# Patient Record
Sex: Male | Born: 1935
Health system: Southern US, Community
[De-identification: ages and names within clinical notes are randomized; demographics above are authoritative.]

## PROBLEM LIST (undated history)

## (undated) DIAGNOSIS — I2510 Atherosclerotic heart disease of native coronary artery without angina pectoris: Secondary | ICD-10-CM

## (undated) DIAGNOSIS — I10 Essential (primary) hypertension: Secondary | ICD-10-CM

## (undated) DIAGNOSIS — R011 Cardiac murmur, unspecified: Secondary | ICD-10-CM

## (undated) DIAGNOSIS — K219 Gastro-esophageal reflux disease without esophagitis: Secondary | ICD-10-CM

## (undated) DIAGNOSIS — N189 Chronic kidney disease, unspecified: Secondary | ICD-10-CM

## (undated) DIAGNOSIS — E785 Hyperlipidemia, unspecified: Secondary | ICD-10-CM

## (undated) DIAGNOSIS — I351 Nonrheumatic aortic (valve) insufficiency: Secondary | ICD-10-CM

## (undated) DIAGNOSIS — I219 Acute myocardial infarction, unspecified: Secondary | ICD-10-CM

## (undated) HISTORY — DX: Hyperlipidemia, unspecified: E78.5

## (undated) HISTORY — DX: Nonrheumatic aortic (valve) insufficiency: I35.1

## (undated) HISTORY — DX: Atherosclerotic heart disease of native coronary artery without angina pectoris: I25.10

## (undated) HISTORY — DX: Essential (primary) hypertension: I10

## (undated) HISTORY — DX: Gastro-esophageal reflux disease without esophagitis: K21.9

## (undated) HISTORY — DX: Chronic kidney disease, unspecified: N18.9

## (undated) HISTORY — DX: Cardiac murmur, unspecified: R01.1

## (undated) HISTORY — DX: Acute myocardial infarction, unspecified: I21.9

---

## 1999-03-21 HISTORY — PX: CORONARY ARTERY BYPASS GRAFT: SHX141

## 2013-08-21 DIAGNOSIS — Z8546 Personal history of malignant neoplasm of prostate: Secondary | ICD-10-CM | POA: Diagnosis not present

## 2013-08-21 DIAGNOSIS — M810 Age-related osteoporosis without current pathological fracture: Secondary | ICD-10-CM | POA: Diagnosis present

## 2013-08-21 DIAGNOSIS — Z85828 Personal history of other malignant neoplasm of skin: Secondary | ICD-10-CM | POA: Diagnosis not present

## 2013-08-21 DIAGNOSIS — R509 Fever, unspecified: Secondary | ICD-10-CM | POA: Diagnosis not present

## 2013-08-21 DIAGNOSIS — N183 Chronic kidney disease, stage 3 unspecified: Secondary | ICD-10-CM | POA: Diagnosis not present

## 2013-08-21 DIAGNOSIS — L03119 Cellulitis of unspecified part of limb: Secondary | ICD-10-CM | POA: Diagnosis not present

## 2013-08-21 DIAGNOSIS — L02419 Cutaneous abscess of limb, unspecified: Secondary | ICD-10-CM | POA: Diagnosis present

## 2013-08-21 DIAGNOSIS — J984 Other disorders of lung: Secondary | ICD-10-CM | POA: Diagnosis not present

## 2013-08-21 DIAGNOSIS — M7989 Other specified soft tissue disorders: Secondary | ICD-10-CM | POA: Diagnosis not present

## 2013-08-21 DIAGNOSIS — E785 Hyperlipidemia, unspecified: Secondary | ICD-10-CM | POA: Diagnosis not present

## 2013-08-21 DIAGNOSIS — I359 Nonrheumatic aortic valve disorder, unspecified: Secondary | ICD-10-CM | POA: Diagnosis not present

## 2013-08-21 DIAGNOSIS — L0291 Cutaneous abscess, unspecified: Secondary | ICD-10-CM | POA: Diagnosis not present

## 2013-08-21 DIAGNOSIS — I872 Venous insufficiency (chronic) (peripheral): Secondary | ICD-10-CM | POA: Diagnosis present

## 2013-08-21 DIAGNOSIS — I951 Orthostatic hypotension: Secondary | ICD-10-CM | POA: Diagnosis not present

## 2013-08-21 DIAGNOSIS — I129 Hypertensive chronic kidney disease with stage 1 through stage 4 chronic kidney disease, or unspecified chronic kidney disease: Secondary | ICD-10-CM | POA: Diagnosis present

## 2013-08-21 DIAGNOSIS — I2510 Atherosclerotic heart disease of native coronary artery without angina pectoris: Secondary | ICD-10-CM | POA: Diagnosis not present

## 2013-08-26 DIAGNOSIS — L0390 Cellulitis, unspecified: Secondary | ICD-10-CM | POA: Diagnosis not present

## 2013-08-26 DIAGNOSIS — L0291 Cutaneous abscess, unspecified: Secondary | ICD-10-CM | POA: Diagnosis not present

## 2013-09-01 DIAGNOSIS — L02419 Cutaneous abscess of limb, unspecified: Secondary | ICD-10-CM | POA: Diagnosis not present

## 2013-09-01 DIAGNOSIS — R918 Other nonspecific abnormal finding of lung field: Secondary | ICD-10-CM | POA: Diagnosis not present

## 2013-09-01 DIAGNOSIS — N183 Chronic kidney disease, stage 3 unspecified: Secondary | ICD-10-CM | POA: Diagnosis not present

## 2013-09-30 DIAGNOSIS — C61 Malignant neoplasm of prostate: Secondary | ICD-10-CM | POA: Diagnosis not present

## 2013-09-30 DIAGNOSIS — N183 Chronic kidney disease, stage 3 unspecified: Secondary | ICD-10-CM | POA: Diagnosis not present

## 2013-09-30 DIAGNOSIS — Z0000 Encounter for general adult medical examination without abnormal findings: Secondary | ICD-10-CM | POA: Diagnosis not present

## 2013-09-30 DIAGNOSIS — I10 Essential (primary) hypertension: Secondary | ICD-10-CM | POA: Diagnosis not present

## 2013-09-30 DIAGNOSIS — Z125 Encounter for screening for malignant neoplasm of prostate: Secondary | ICD-10-CM | POA: Diagnosis not present

## 2013-09-30 DIAGNOSIS — E782 Mixed hyperlipidemia: Secondary | ICD-10-CM | POA: Diagnosis not present

## 2013-09-30 DIAGNOSIS — I2510 Atherosclerotic heart disease of native coronary artery without angina pectoris: Secondary | ICD-10-CM | POA: Diagnosis not present

## 2013-11-28 DIAGNOSIS — L738 Other specified follicular disorders: Secondary | ICD-10-CM | POA: Diagnosis not present

## 2013-11-28 DIAGNOSIS — L280 Lichen simplex chronicus: Secondary | ICD-10-CM | POA: Diagnosis not present

## 2013-12-03 DIAGNOSIS — Z23 Encounter for immunization: Secondary | ICD-10-CM | POA: Diagnosis not present

## 2014-04-02 DIAGNOSIS — L281 Prurigo nodularis: Secondary | ICD-10-CM | POA: Diagnosis not present

## 2014-04-02 DIAGNOSIS — Z85828 Personal history of other malignant neoplasm of skin: Secondary | ICD-10-CM | POA: Diagnosis not present

## 2014-04-02 DIAGNOSIS — D485 Neoplasm of uncertain behavior of skin: Secondary | ICD-10-CM | POA: Diagnosis not present

## 2014-04-07 DIAGNOSIS — E782 Mixed hyperlipidemia: Secondary | ICD-10-CM | POA: Diagnosis not present

## 2014-04-07 DIAGNOSIS — I10 Essential (primary) hypertension: Secondary | ICD-10-CM | POA: Diagnosis not present

## 2014-04-07 DIAGNOSIS — Z23 Encounter for immunization: Secondary | ICD-10-CM | POA: Diagnosis not present

## 2014-04-07 DIAGNOSIS — K219 Gastro-esophageal reflux disease without esophagitis: Secondary | ICD-10-CM | POA: Diagnosis not present

## 2014-05-21 DIAGNOSIS — X32XXXA Exposure to sunlight, initial encounter: Secondary | ICD-10-CM | POA: Diagnosis not present

## 2014-05-21 DIAGNOSIS — L280 Lichen simplex chronicus: Secondary | ICD-10-CM | POA: Diagnosis not present

## 2014-05-21 DIAGNOSIS — L570 Actinic keratosis: Secondary | ICD-10-CM | POA: Diagnosis not present

## 2014-06-01 DIAGNOSIS — K219 Gastro-esophageal reflux disease without esophagitis: Secondary | ICD-10-CM | POA: Diagnosis not present

## 2014-06-01 DIAGNOSIS — E782 Mixed hyperlipidemia: Secondary | ICD-10-CM | POA: Diagnosis not present

## 2014-06-01 DIAGNOSIS — I2510 Atherosclerotic heart disease of native coronary artery without angina pectoris: Secondary | ICD-10-CM | POA: Diagnosis not present

## 2014-06-01 DIAGNOSIS — I10 Essential (primary) hypertension: Secondary | ICD-10-CM | POA: Diagnosis not present

## 2014-08-27 DIAGNOSIS — I350 Nonrheumatic aortic (valve) stenosis: Secondary | ICD-10-CM | POA: Diagnosis not present

## 2014-08-27 DIAGNOSIS — R351 Nocturia: Secondary | ICD-10-CM | POA: Diagnosis not present

## 2014-08-27 DIAGNOSIS — R600 Localized edema: Secondary | ICD-10-CM | POA: Diagnosis not present

## 2014-08-27 DIAGNOSIS — R42 Dizziness and giddiness: Secondary | ICD-10-CM | POA: Diagnosis not present

## 2014-10-02 DIAGNOSIS — L821 Other seborrheic keratosis: Secondary | ICD-10-CM | POA: Diagnosis not present

## 2014-10-02 DIAGNOSIS — L578 Other skin changes due to chronic exposure to nonionizing radiation: Secondary | ICD-10-CM | POA: Diagnosis not present

## 2014-10-02 DIAGNOSIS — D1801 Hemangioma of skin and subcutaneous tissue: Secondary | ICD-10-CM | POA: Diagnosis not present

## 2014-10-02 DIAGNOSIS — L570 Actinic keratosis: Secondary | ICD-10-CM | POA: Diagnosis not present

## 2014-10-02 DIAGNOSIS — L309 Dermatitis, unspecified: Secondary | ICD-10-CM | POA: Diagnosis not present

## 2014-10-05 DIAGNOSIS — E785 Hyperlipidemia, unspecified: Secondary | ICD-10-CM | POA: Diagnosis not present

## 2014-10-05 DIAGNOSIS — I2510 Atherosclerotic heart disease of native coronary artery without angina pectoris: Secondary | ICD-10-CM | POA: Diagnosis not present

## 2014-10-05 DIAGNOSIS — I10 Essential (primary) hypertension: Secondary | ICD-10-CM | POA: Diagnosis not present

## 2014-10-06 DIAGNOSIS — R350 Frequency of micturition: Secondary | ICD-10-CM | POA: Diagnosis not present

## 2014-10-19 DIAGNOSIS — I2510 Atherosclerotic heart disease of native coronary artery without angina pectoris: Secondary | ICD-10-CM | POA: Diagnosis not present

## 2014-10-19 DIAGNOSIS — I252 Old myocardial infarction: Secondary | ICD-10-CM | POA: Diagnosis not present

## 2014-10-19 DIAGNOSIS — Z951 Presence of aortocoronary bypass graft: Secondary | ICD-10-CM | POA: Diagnosis not present

## 2014-10-22 DIAGNOSIS — R351 Nocturia: Secondary | ICD-10-CM | POA: Diagnosis not present

## 2014-11-30 DIAGNOSIS — R351 Nocturia: Secondary | ICD-10-CM | POA: Diagnosis not present

## 2014-12-01 DIAGNOSIS — H25041 Posterior subcapsular polar age-related cataract, right eye: Secondary | ICD-10-CM | POA: Diagnosis not present

## 2014-12-01 DIAGNOSIS — H52203 Unspecified astigmatism, bilateral: Secondary | ICD-10-CM | POA: Diagnosis not present

## 2014-12-01 DIAGNOSIS — H5213 Myopia, bilateral: Secondary | ICD-10-CM | POA: Diagnosis not present

## 2014-12-01 DIAGNOSIS — H524 Presbyopia: Secondary | ICD-10-CM | POA: Diagnosis not present

## 2014-12-01 DIAGNOSIS — H2513 Age-related nuclear cataract, bilateral: Secondary | ICD-10-CM | POA: Diagnosis not present

## 2014-12-03 DIAGNOSIS — R351 Nocturia: Secondary | ICD-10-CM | POA: Diagnosis not present

## 2014-12-08 DIAGNOSIS — E785 Hyperlipidemia, unspecified: Secondary | ICD-10-CM | POA: Diagnosis not present

## 2014-12-08 DIAGNOSIS — I10 Essential (primary) hypertension: Secondary | ICD-10-CM | POA: Diagnosis not present

## 2014-12-14 DIAGNOSIS — E785 Hyperlipidemia, unspecified: Secondary | ICD-10-CM | POA: Diagnosis not present

## 2014-12-14 DIAGNOSIS — I10 Essential (primary) hypertension: Secondary | ICD-10-CM | POA: Diagnosis not present

## 2014-12-14 DIAGNOSIS — T464X5A Adverse effect of angiotensin-converting-enzyme inhibitors, initial encounter: Secondary | ICD-10-CM | POA: Diagnosis not present

## 2014-12-14 DIAGNOSIS — I2510 Atherosclerotic heart disease of native coronary artery without angina pectoris: Secondary | ICD-10-CM | POA: Diagnosis not present

## 2014-12-25 DIAGNOSIS — Z23 Encounter for immunization: Secondary | ICD-10-CM | POA: Diagnosis not present

## 2015-04-09 DIAGNOSIS — Z85828 Personal history of other malignant neoplasm of skin: Secondary | ICD-10-CM | POA: Diagnosis not present

## 2015-04-09 DIAGNOSIS — C44222 Squamous cell carcinoma of skin of right ear and external auricular canal: Secondary | ICD-10-CM | POA: Diagnosis not present

## 2015-04-09 DIAGNOSIS — L309 Dermatitis, unspecified: Secondary | ICD-10-CM | POA: Diagnosis not present

## 2015-04-15 DIAGNOSIS — Z125 Encounter for screening for malignant neoplasm of prostate: Secondary | ICD-10-CM | POA: Diagnosis not present

## 2015-04-15 DIAGNOSIS — I10 Essential (primary) hypertension: Secondary | ICD-10-CM | POA: Diagnosis not present

## 2015-04-15 DIAGNOSIS — R42 Dizziness and giddiness: Secondary | ICD-10-CM | POA: Diagnosis not present

## 2015-04-15 DIAGNOSIS — R601 Generalized edema: Secondary | ICD-10-CM | POA: Diagnosis not present

## 2015-04-22 DIAGNOSIS — C61 Malignant neoplasm of prostate: Secondary | ICD-10-CM | POA: Diagnosis not present

## 2015-04-22 DIAGNOSIS — N183 Chronic kidney disease, stage 3 (moderate): Secondary | ICD-10-CM | POA: Diagnosis not present

## 2015-04-22 DIAGNOSIS — I10 Essential (primary) hypertension: Secondary | ICD-10-CM | POA: Diagnosis not present

## 2015-04-22 DIAGNOSIS — I2510 Atherosclerotic heart disease of native coronary artery without angina pectoris: Secondary | ICD-10-CM | POA: Diagnosis not present

## 2015-04-22 DIAGNOSIS — Z1389 Encounter for screening for other disorder: Secondary | ICD-10-CM | POA: Diagnosis not present

## 2015-04-22 DIAGNOSIS — R42 Dizziness and giddiness: Secondary | ICD-10-CM | POA: Diagnosis not present

## 2015-04-22 DIAGNOSIS — E784 Other hyperlipidemia: Secondary | ICD-10-CM | POA: Diagnosis not present

## 2015-04-22 DIAGNOSIS — R351 Nocturia: Secondary | ICD-10-CM | POA: Diagnosis not present

## 2015-04-22 DIAGNOSIS — Z6825 Body mass index (BMI) 25.0-25.9, adult: Secondary | ICD-10-CM | POA: Diagnosis not present

## 2015-04-22 DIAGNOSIS — Z0000 Encounter for general adult medical examination without abnormal findings: Secondary | ICD-10-CM | POA: Diagnosis not present

## 2015-04-28 DIAGNOSIS — E785 Hyperlipidemia, unspecified: Secondary | ICD-10-CM | POA: Diagnosis not present

## 2015-04-28 DIAGNOSIS — I10 Essential (primary) hypertension: Secondary | ICD-10-CM | POA: Diagnosis not present

## 2015-04-28 DIAGNOSIS — I2510 Atherosclerotic heart disease of native coronary artery without angina pectoris: Secondary | ICD-10-CM | POA: Diagnosis not present

## 2015-05-27 DIAGNOSIS — R350 Frequency of micturition: Secondary | ICD-10-CM | POA: Diagnosis not present

## 2015-05-27 DIAGNOSIS — Z0000 Encounter for general adult medical examination without abnormal findings: Secondary | ICD-10-CM | POA: Diagnosis not present

## 2015-05-27 DIAGNOSIS — R351 Nocturia: Secondary | ICD-10-CM | POA: Diagnosis not present

## 2015-07-01 DIAGNOSIS — E785 Hyperlipidemia, unspecified: Secondary | ICD-10-CM | POA: Diagnosis not present

## 2015-07-01 DIAGNOSIS — I2510 Atherosclerotic heart disease of native coronary artery without angina pectoris: Secondary | ICD-10-CM | POA: Diagnosis not present

## 2015-09-01 DIAGNOSIS — I10 Essential (primary) hypertension: Secondary | ICD-10-CM | POA: Diagnosis not present

## 2015-09-01 DIAGNOSIS — E785 Hyperlipidemia, unspecified: Secondary | ICD-10-CM | POA: Diagnosis not present

## 2015-09-01 DIAGNOSIS — I2510 Atherosclerotic heart disease of native coronary artery without angina pectoris: Secondary | ICD-10-CM | POA: Diagnosis not present

## 2015-11-29 DIAGNOSIS — Z23 Encounter for immunization: Secondary | ICD-10-CM | POA: Diagnosis not present

## 2015-12-22 DIAGNOSIS — S62304A Unspecified fracture of fourth metacarpal bone, right hand, initial encounter for closed fracture: Secondary | ICD-10-CM | POA: Diagnosis not present

## 2015-12-29 DIAGNOSIS — I2510 Atherosclerotic heart disease of native coronary artery without angina pectoris: Secondary | ICD-10-CM | POA: Diagnosis not present

## 2015-12-29 DIAGNOSIS — E785 Hyperlipidemia, unspecified: Secondary | ICD-10-CM | POA: Diagnosis not present

## 2016-01-04 DIAGNOSIS — I2510 Atherosclerotic heart disease of native coronary artery without angina pectoris: Secondary | ICD-10-CM | POA: Diagnosis not present

## 2016-01-04 DIAGNOSIS — E785 Hyperlipidemia, unspecified: Secondary | ICD-10-CM | POA: Diagnosis not present

## 2016-01-04 DIAGNOSIS — I10 Essential (primary) hypertension: Secondary | ICD-10-CM | POA: Diagnosis not present

## 2016-01-12 DIAGNOSIS — S62304D Unspecified fracture of fourth metacarpal bone, right hand, subsequent encounter for fracture with routine healing: Secondary | ICD-10-CM | POA: Diagnosis not present

## 2016-03-22 DIAGNOSIS — H25041 Posterior subcapsular polar age-related cataract, right eye: Secondary | ICD-10-CM | POA: Diagnosis not present

## 2016-03-22 DIAGNOSIS — H2513 Age-related nuclear cataract, bilateral: Secondary | ICD-10-CM | POA: Diagnosis not present

## 2016-04-11 DIAGNOSIS — L570 Actinic keratosis: Secondary | ICD-10-CM | POA: Diagnosis not present

## 2016-04-11 DIAGNOSIS — L309 Dermatitis, unspecified: Secondary | ICD-10-CM | POA: Diagnosis not present

## 2016-04-11 DIAGNOSIS — D1801 Hemangioma of skin and subcutaneous tissue: Secondary | ICD-10-CM | POA: Diagnosis not present

## 2016-04-11 DIAGNOSIS — Z85828 Personal history of other malignant neoplasm of skin: Secondary | ICD-10-CM | POA: Diagnosis not present

## 2016-04-11 DIAGNOSIS — C44622 Squamous cell carcinoma of skin of right upper limb, including shoulder: Secondary | ICD-10-CM | POA: Diagnosis not present

## 2016-04-21 DIAGNOSIS — R8299 Other abnormal findings in urine: Secondary | ICD-10-CM | POA: Diagnosis not present

## 2016-04-21 DIAGNOSIS — N183 Chronic kidney disease, stage 3 (moderate): Secondary | ICD-10-CM | POA: Diagnosis not present

## 2016-04-21 DIAGNOSIS — E784 Other hyperlipidemia: Secondary | ICD-10-CM | POA: Diagnosis not present

## 2016-04-21 DIAGNOSIS — Z125 Encounter for screening for malignant neoplasm of prostate: Secondary | ICD-10-CM | POA: Diagnosis not present

## 2016-04-28 DIAGNOSIS — N183 Chronic kidney disease, stage 3 (moderate): Secondary | ICD-10-CM | POA: Diagnosis not present

## 2016-04-28 DIAGNOSIS — I2510 Atherosclerotic heart disease of native coronary artery without angina pectoris: Secondary | ICD-10-CM | POA: Diagnosis not present

## 2016-04-28 DIAGNOSIS — Z1389 Encounter for screening for other disorder: Secondary | ICD-10-CM | POA: Diagnosis not present

## 2016-04-28 DIAGNOSIS — C61 Malignant neoplasm of prostate: Secondary | ICD-10-CM | POA: Diagnosis not present

## 2016-04-28 DIAGNOSIS — R351 Nocturia: Secondary | ICD-10-CM | POA: Diagnosis not present

## 2016-04-28 DIAGNOSIS — Z6825 Body mass index (BMI) 25.0-25.9, adult: Secondary | ICD-10-CM | POA: Diagnosis not present

## 2016-04-28 DIAGNOSIS — Z0000 Encounter for general adult medical examination without abnormal findings: Secondary | ICD-10-CM | POA: Diagnosis not present

## 2016-04-28 DIAGNOSIS — I10 Essential (primary) hypertension: Secondary | ICD-10-CM | POA: Diagnosis not present

## 2016-05-16 DIAGNOSIS — H2511 Age-related nuclear cataract, right eye: Secondary | ICD-10-CM | POA: Diagnosis not present

## 2016-05-16 DIAGNOSIS — H2513 Age-related nuclear cataract, bilateral: Secondary | ICD-10-CM | POA: Diagnosis not present

## 2016-05-16 DIAGNOSIS — H02839 Dermatochalasis of unspecified eye, unspecified eyelid: Secondary | ICD-10-CM | POA: Diagnosis not present

## 2016-05-16 DIAGNOSIS — H25013 Cortical age-related cataract, bilateral: Secondary | ICD-10-CM | POA: Diagnosis not present

## 2016-05-16 DIAGNOSIS — H18413 Arcus senilis, bilateral: Secondary | ICD-10-CM | POA: Diagnosis not present

## 2016-06-06 DIAGNOSIS — I10 Essential (primary) hypertension: Secondary | ICD-10-CM | POA: Diagnosis not present

## 2016-06-06 DIAGNOSIS — I2510 Atherosclerotic heart disease of native coronary artery without angina pectoris: Secondary | ICD-10-CM | POA: Diagnosis not present

## 2016-06-06 DIAGNOSIS — E785 Hyperlipidemia, unspecified: Secondary | ICD-10-CM | POA: Diagnosis not present

## 2016-06-26 DIAGNOSIS — H2511 Age-related nuclear cataract, right eye: Secondary | ICD-10-CM | POA: Diagnosis not present

## 2016-06-26 DIAGNOSIS — H2513 Age-related nuclear cataract, bilateral: Secondary | ICD-10-CM | POA: Diagnosis not present

## 2016-10-09 DIAGNOSIS — I10 Essential (primary) hypertension: Secondary | ICD-10-CM | POA: Diagnosis not present

## 2016-10-09 DIAGNOSIS — I2510 Atherosclerotic heart disease of native coronary artery without angina pectoris: Secondary | ICD-10-CM | POA: Diagnosis not present

## 2016-10-09 DIAGNOSIS — E785 Hyperlipidemia, unspecified: Secondary | ICD-10-CM | POA: Diagnosis not present

## 2016-10-09 DIAGNOSIS — R011 Cardiac murmur, unspecified: Secondary | ICD-10-CM | POA: Diagnosis not present

## 2016-10-10 DIAGNOSIS — L281 Prurigo nodularis: Secondary | ICD-10-CM | POA: Diagnosis not present

## 2016-10-10 DIAGNOSIS — L309 Dermatitis, unspecified: Secondary | ICD-10-CM | POA: Diagnosis not present

## 2016-10-10 DIAGNOSIS — L859 Epidermal thickening, unspecified: Secondary | ICD-10-CM | POA: Diagnosis not present

## 2016-10-10 DIAGNOSIS — L814 Other melanin hyperpigmentation: Secondary | ICD-10-CM | POA: Diagnosis not present

## 2016-10-10 DIAGNOSIS — L739 Follicular disorder, unspecified: Secondary | ICD-10-CM | POA: Diagnosis not present

## 2016-10-10 DIAGNOSIS — L570 Actinic keratosis: Secondary | ICD-10-CM | POA: Diagnosis not present

## 2016-10-10 DIAGNOSIS — Z85828 Personal history of other malignant neoplasm of skin: Secondary | ICD-10-CM | POA: Diagnosis not present

## 2016-10-10 DIAGNOSIS — D692 Other nonthrombocytopenic purpura: Secondary | ICD-10-CM | POA: Diagnosis not present

## 2016-10-25 DIAGNOSIS — R011 Cardiac murmur, unspecified: Secondary | ICD-10-CM | POA: Diagnosis not present

## 2016-10-25 DIAGNOSIS — Z136 Encounter for screening for cardiovascular disorders: Secondary | ICD-10-CM | POA: Diagnosis not present

## 2016-10-31 DIAGNOSIS — Z23 Encounter for immunization: Secondary | ICD-10-CM | POA: Diagnosis not present

## 2016-11-28 DIAGNOSIS — R351 Nocturia: Secondary | ICD-10-CM | POA: Diagnosis not present

## 2016-11-28 DIAGNOSIS — N289 Disorder of kidney and ureter, unspecified: Secondary | ICD-10-CM | POA: Diagnosis not present

## 2016-12-26 DIAGNOSIS — R351 Nocturia: Secondary | ICD-10-CM | POA: Diagnosis not present

## 2017-04-30 DIAGNOSIS — I2510 Atherosclerotic heart disease of native coronary artery without angina pectoris: Secondary | ICD-10-CM | POA: Diagnosis not present

## 2017-04-30 DIAGNOSIS — I351 Nonrheumatic aortic (valve) insufficiency: Secondary | ICD-10-CM | POA: Diagnosis not present

## 2017-04-30 DIAGNOSIS — I10 Essential (primary) hypertension: Secondary | ICD-10-CM | POA: Diagnosis not present

## 2017-04-30 DIAGNOSIS — E785 Hyperlipidemia, unspecified: Secondary | ICD-10-CM | POA: Diagnosis not present

## 2017-05-01 DIAGNOSIS — E785 Hyperlipidemia, unspecified: Secondary | ICD-10-CM | POA: Diagnosis not present

## 2017-05-01 DIAGNOSIS — I2510 Atherosclerotic heart disease of native coronary artery without angina pectoris: Secondary | ICD-10-CM | POA: Diagnosis not present

## 2017-05-29 DIAGNOSIS — R82998 Other abnormal findings in urine: Secondary | ICD-10-CM | POA: Diagnosis not present

## 2017-05-29 DIAGNOSIS — I10 Essential (primary) hypertension: Secondary | ICD-10-CM | POA: Diagnosis not present

## 2017-05-29 DIAGNOSIS — E7849 Other hyperlipidemia: Secondary | ICD-10-CM | POA: Diagnosis not present

## 2017-05-29 DIAGNOSIS — Z125 Encounter for screening for malignant neoplasm of prostate: Secondary | ICD-10-CM | POA: Diagnosis not present

## 2017-06-05 DIAGNOSIS — R600 Localized edema: Secondary | ICD-10-CM | POA: Diagnosis not present

## 2017-06-05 DIAGNOSIS — C61 Malignant neoplasm of prostate: Secondary | ICD-10-CM | POA: Diagnosis not present

## 2017-06-05 DIAGNOSIS — E7849 Other hyperlipidemia: Secondary | ICD-10-CM | POA: Diagnosis not present

## 2017-06-05 DIAGNOSIS — I10 Essential (primary) hypertension: Secondary | ICD-10-CM | POA: Diagnosis not present

## 2017-06-05 DIAGNOSIS — I351 Nonrheumatic aortic (valve) insufficiency: Secondary | ICD-10-CM | POA: Diagnosis not present

## 2017-06-05 DIAGNOSIS — N183 Chronic kidney disease, stage 3 (moderate): Secondary | ICD-10-CM | POA: Diagnosis not present

## 2017-06-05 DIAGNOSIS — Z6824 Body mass index (BMI) 24.0-24.9, adult: Secondary | ICD-10-CM | POA: Diagnosis not present

## 2017-06-05 DIAGNOSIS — I2510 Atherosclerotic heart disease of native coronary artery without angina pectoris: Secondary | ICD-10-CM | POA: Diagnosis not present

## 2017-06-05 DIAGNOSIS — I350 Nonrheumatic aortic (valve) stenosis: Secondary | ICD-10-CM | POA: Diagnosis not present

## 2017-06-05 DIAGNOSIS — Z0000 Encounter for general adult medical examination without abnormal findings: Secondary | ICD-10-CM | POA: Diagnosis not present

## 2017-06-05 DIAGNOSIS — Z1389 Encounter for screening for other disorder: Secondary | ICD-10-CM | POA: Diagnosis not present

## 2017-06-05 DIAGNOSIS — F0390 Unspecified dementia without behavioral disturbance: Secondary | ICD-10-CM | POA: Diagnosis not present

## 2017-06-06 DIAGNOSIS — Z1212 Encounter for screening for malignant neoplasm of rectum: Secondary | ICD-10-CM | POA: Diagnosis not present

## 2017-08-02 DIAGNOSIS — H2512 Age-related nuclear cataract, left eye: Secondary | ICD-10-CM | POA: Diagnosis not present

## 2017-08-21 DIAGNOSIS — E785 Hyperlipidemia, unspecified: Secondary | ICD-10-CM | POA: Diagnosis not present

## 2017-08-21 DIAGNOSIS — I351 Nonrheumatic aortic (valve) insufficiency: Secondary | ICD-10-CM | POA: Diagnosis not present

## 2017-08-21 DIAGNOSIS — I2510 Atherosclerotic heart disease of native coronary artery without angina pectoris: Secondary | ICD-10-CM | POA: Diagnosis not present

## 2017-08-21 DIAGNOSIS — I10 Essential (primary) hypertension: Secondary | ICD-10-CM | POA: Diagnosis not present

## 2017-09-11 DIAGNOSIS — H18413 Arcus senilis, bilateral: Secondary | ICD-10-CM | POA: Diagnosis not present

## 2017-09-11 DIAGNOSIS — H2512 Age-related nuclear cataract, left eye: Secondary | ICD-10-CM | POA: Diagnosis not present

## 2017-09-11 DIAGNOSIS — Z961 Presence of intraocular lens: Secondary | ICD-10-CM | POA: Diagnosis not present

## 2017-09-11 DIAGNOSIS — H02831 Dermatochalasis of right upper eyelid: Secondary | ICD-10-CM | POA: Diagnosis not present

## 2017-10-04 DIAGNOSIS — H2512 Age-related nuclear cataract, left eye: Secondary | ICD-10-CM | POA: Diagnosis not present

## 2017-10-05 DIAGNOSIS — H2512 Age-related nuclear cataract, left eye: Secondary | ICD-10-CM | POA: Diagnosis not present

## 2017-10-11 DIAGNOSIS — L821 Other seborrheic keratosis: Secondary | ICD-10-CM | POA: Diagnosis not present

## 2017-10-11 DIAGNOSIS — C44629 Squamous cell carcinoma of skin of left upper limb, including shoulder: Secondary | ICD-10-CM | POA: Diagnosis not present

## 2017-10-11 DIAGNOSIS — L309 Dermatitis, unspecified: Secondary | ICD-10-CM | POA: Diagnosis not present

## 2017-10-11 DIAGNOSIS — Z85828 Personal history of other malignant neoplasm of skin: Secondary | ICD-10-CM | POA: Diagnosis not present

## 2017-10-11 DIAGNOSIS — L814 Other melanin hyperpigmentation: Secondary | ICD-10-CM | POA: Diagnosis not present

## 2017-10-11 DIAGNOSIS — L570 Actinic keratosis: Secondary | ICD-10-CM | POA: Diagnosis not present

## 2017-12-01 DIAGNOSIS — Z23 Encounter for immunization: Secondary | ICD-10-CM | POA: Diagnosis not present

## 2017-12-11 DIAGNOSIS — H26491 Other secondary cataract, right eye: Secondary | ICD-10-CM | POA: Diagnosis not present

## 2017-12-17 DIAGNOSIS — I351 Nonrheumatic aortic (valve) insufficiency: Secondary | ICD-10-CM | POA: Diagnosis not present

## 2017-12-17 DIAGNOSIS — I2510 Atherosclerotic heart disease of native coronary artery without angina pectoris: Secondary | ICD-10-CM | POA: Diagnosis not present

## 2017-12-17 DIAGNOSIS — E785 Hyperlipidemia, unspecified: Secondary | ICD-10-CM | POA: Diagnosis not present

## 2017-12-17 DIAGNOSIS — I10 Essential (primary) hypertension: Secondary | ICD-10-CM | POA: Diagnosis not present

## 2017-12-18 DIAGNOSIS — H26491 Other secondary cataract, right eye: Secondary | ICD-10-CM | POA: Diagnosis not present

## 2018-04-03 DIAGNOSIS — I351 Nonrheumatic aortic (valve) insufficiency: Secondary | ICD-10-CM | POA: Diagnosis not present

## 2018-04-03 DIAGNOSIS — I350 Nonrheumatic aortic (valve) stenosis: Secondary | ICD-10-CM | POA: Diagnosis not present

## 2018-04-16 DIAGNOSIS — I351 Nonrheumatic aortic (valve) insufficiency: Secondary | ICD-10-CM | POA: Diagnosis not present

## 2018-04-16 DIAGNOSIS — E785 Hyperlipidemia, unspecified: Secondary | ICD-10-CM | POA: Diagnosis not present

## 2018-04-16 DIAGNOSIS — I2510 Atherosclerotic heart disease of native coronary artery without angina pectoris: Secondary | ICD-10-CM | POA: Diagnosis not present

## 2018-04-16 DIAGNOSIS — I129 Hypertensive chronic kidney disease with stage 1 through stage 4 chronic kidney disease, or unspecified chronic kidney disease: Secondary | ICD-10-CM | POA: Diagnosis not present

## 2018-05-21 ENCOUNTER — Other Ambulatory Visit: Payer: Self-pay

## 2018-05-21 MED ORDER — METOPROLOL TARTRATE 50 MG PO TABS: 50.0000 mg | ORAL_TABLET | Freq: Two times a day (BID) | ORAL | 1 refills | Status: DC

## 2018-05-22 ENCOUNTER — Other Ambulatory Visit: Payer: Self-pay

## 2018-05-22 MED ORDER — METOPROLOL TARTRATE 50 MG PO TABS: 50.0000 mg | ORAL_TABLET | Freq: Two times a day (BID) | ORAL | 1 refills | Status: DC

## 2018-06-10 DIAGNOSIS — E7849 Other hyperlipidemia: Secondary | ICD-10-CM | POA: Diagnosis not present

## 2018-06-10 DIAGNOSIS — Z125 Encounter for screening for malignant neoplasm of prostate: Secondary | ICD-10-CM | POA: Diagnosis not present

## 2018-06-10 DIAGNOSIS — I10 Essential (primary) hypertension: Secondary | ICD-10-CM | POA: Diagnosis not present

## 2018-06-17 DIAGNOSIS — I10 Essential (primary) hypertension: Secondary | ICD-10-CM | POA: Diagnosis not present

## 2018-06-17 DIAGNOSIS — R82998 Other abnormal findings in urine: Secondary | ICD-10-CM | POA: Diagnosis not present

## 2018-06-17 DIAGNOSIS — I351 Nonrheumatic aortic (valve) insufficiency: Secondary | ICD-10-CM | POA: Diagnosis not present

## 2018-06-17 DIAGNOSIS — Z0000 Encounter for general adult medical examination without abnormal findings: Secondary | ICD-10-CM | POA: Diagnosis not present

## 2018-06-17 DIAGNOSIS — E7849 Other hyperlipidemia: Secondary | ICD-10-CM | POA: Diagnosis not present

## 2018-06-17 DIAGNOSIS — C61 Malignant neoplasm of prostate: Secondary | ICD-10-CM | POA: Diagnosis not present

## 2018-06-17 DIAGNOSIS — R600 Localized edema: Secondary | ICD-10-CM | POA: Diagnosis not present

## 2018-06-17 DIAGNOSIS — N183 Chronic kidney disease, stage 3 (moderate): Secondary | ICD-10-CM | POA: Diagnosis not present

## 2018-06-17 DIAGNOSIS — Z1339 Encounter for screening examination for other mental health and behavioral disorders: Secondary | ICD-10-CM | POA: Diagnosis not present

## 2018-06-17 DIAGNOSIS — I350 Nonrheumatic aortic (valve) stenosis: Secondary | ICD-10-CM | POA: Diagnosis not present

## 2018-06-17 DIAGNOSIS — F0390 Unspecified dementia without behavioral disturbance: Secondary | ICD-10-CM | POA: Diagnosis not present

## 2018-06-17 DIAGNOSIS — I2510 Atherosclerotic heart disease of native coronary artery without angina pectoris: Secondary | ICD-10-CM | POA: Diagnosis not present

## 2018-06-17 DIAGNOSIS — Z1331 Encounter for screening for depression: Secondary | ICD-10-CM | POA: Diagnosis not present

## 2018-08-03 DIAGNOSIS — N183 Chronic kidney disease, stage 3 unspecified: Secondary | ICD-10-CM | POA: Insufficient documentation

## 2018-08-05 NOTE — Progress Notes (Deleted)
Subjective:  Primary Physician:  Leanna Battles, MD  Patient ID: Trevor Cooper., male    DOB: 1931-12-31, 85 y.o.   MRN: [id number]  No chief complaint on file.   HPI: Trevor Cooper.  is a 85 y.o. male . He is s/p IWMI in Dec, 2002, and s/p CABG x3- Dec. 2002 in [place]. Pt. recd. LIMA to LAD, SVG to OM, SVG to PDA. Lexiscan Myoview scans were negative for ischemia on 10/19/2014.  Patient has done fairly well since CABG. He denies any complaints of chest pain, tightness or pressure. No complaints of shortness of breath, orthopnea or PND. No palpitation, sudden heart racing or irregular heartbeat at any time. Patient has occasional mild dizziness for a few seconds. No c/o near syncope or syncope. No c/o palpitation. Patient is complaining of feeling low energy.  Patient has history of mild chronic swelling on the left leg for few years, controlled with wearing compression stockings. There is no history of DVT. He did not have veins harvested from left leg for CABG. No pain in the left leg and no history of claudication.  Patient has hypertension. No history of diabetes. He has hypercholesterolemia. He does not smoke. He walks for half a mile daily.  Patient has chronic kidney disease, stage III. No history of thyroid problems. No history of TIA or CVA. Patient is complaining of slight loss of memory, it has not changed in a long time. He also has bruising on the hands but there is no history of GI bleed, hematuria or any other bleeding.  Past Medical History:  Social History   Socioeconomic History  . Marital status: Married    Spouse name: Not on file  . Number of children: Not on file  . Years of education: Not on file  . Highest education level: Not on file  Occupational History  . Not on file  Social Needs  . Financial resource strain: Not on file  . Food insecurity:    Worry: Not on file    Inability: Not on file  . Transportation needs:   Medical: Not on file    Non-medical: Not on file  Tobacco Use  . Smoking status: Not on file  Substance and Sexual Activity  . Alcohol use: Not on file  . Drug use: Not on file  . Sexual activity: Not on file  Lifestyle  . Physical activity:    Days per week: Not on file    Minutes per session: Not on file  . Stress: Not on file  Relationships  . Social connections:    Talks on phone: Not on file    Gets together: Not on file    Attends religious service: Not on file    Active member of club or organization: Not on file    Attends meetings of clubs or organizations: Not on file    Relationship status: Not on file  . Intimate partner violence:    Fear of current or ex partner: Not on file    Emotionally abused: Not on file    Physically abused: Not on file    Forced sexual activity: Not on file  Other Topics Concern  . Not on file  Social History Narrative  . Not on file    Current Outpatient Medications on File Prior to Visit  Medication Sig Dispense Refill  . metoprolol tartrate (LOPRESSOR) 50 MG tablet Take 1 tablet (50 mg total) by mouth 2 (two)  times daily. Take 1 tablet in the morning, and 1/2 tab in evening. 60 tablet 1   No current facility-administered medications on file prior to visit.    ROS:  Objective:  There were no vitals taken for this visit. There is no height or weight on file to calculate BMI.   PHYSICAL EXAM:   CARDIAC STUDIES:  Lexiscan myoview stress test 10/19/2014: 1. The resting electrocardiogram demonstrated normal sinus rhythm, RBBB and no resting arrhythmias. Stress EKG is non-diagnostic for ischemia as it a pharmacologic stress using Lexiscan. Stress symptoms included dyspnea. 2. Myocardial perfusion imaging is normal. Overall left ventricular systolic function was normal without regional wall motion abnormalities. The left ventricular ejection fraction was 73%.  Echocardiogram 04/03/2018: Left ventricle cavity is normal in size.  Moderate asymmetric hypertrophy of the left ventricle, septum measuring 1.4 cm, posterior wall measuring 1.2 cm. Normal global wall motion. Doppler evidence of grade I (impaired) diastolic dysfunction, normal LAP. Calculated EF 62%. Left atrial cavity is moderately dilated. Trileaflet aortic valve with moderate annular and leaflet calcification. Aortic valve mean gradient of 24 mmHg, Vmax of 3.1 m/s. Calculated aortic valve area by continuity equation is 0.7 cm and dimensionless index of 0.24 suggest possibility of paradoxically low flow lo gradient severe aortic stenosis. Moderate (Grade III) regurgitation. Mild (Grade I) mitral regurgitation. Mild tricuspid regurgitation. Estimated pulmonary artery systolic pressure 29 mmHg. Mild pulmonic regurgitation. Compared to previous study on 10/25/2016, aortic stenosis has progressed in severity.  Carotid Duplex- 07/29/2012- [place]- Mild plaque in the left bulb, proximal ICA and ECA. Mild to moderate calcific plaque in the right carotid bulb, no stenosis of greater than 50%. Normal right ICA and ECA. Antegrade flow in both the vertebrals.  Assessment & Recommendations:   1. Atherosclerosis of native coronary artery of native heart without angina pectoris  2. Nonrheumatic aortic insufficiency with aortic stenosis  3. Essential hypertension  4. Hypercholesterolemia  5. CKD (chronic kidney disease) stage 3, GFR 30-59 ml/min (HCC)   Laboratory Exam:  05/29/2017-BUN-34, creatinine-1.6, GFR-41.5. Glucose-114, sodium-138, potassium-4.3 WBC-7.7, hemoglobin-11.8, hematocrit-35.5, platelets-205.  Lipid Panel  05/01/2017-cholesterol-110, HDL-38, LDL-55, triglycerides-84. Normal liver enzymes.  Recommendation:  Cardiac status remains stable. Patient does not have angina or CHF. Echocardiogram results were explained to the patient. Aortic regurgitation is still moderate. Aortic stenosis has increased in severity from before but patient remains  asymptomatic and we will follow it.  His blood pressure is well controlled. Patient brought the blood pressure recordings from home, I have reviewed those. Vast majority of blood pressure readings are normal. I have advised him to continue present medications.  Chronic edema on the left leg is due to venous insufficiency, it has improved from before. I have advised him to continue to wear compression stockings all the time.  Secondary prevention was again explained. He was advised to follow low-salt, low-cholesterol diet and was encouraged to continue walking regularly.  I will see him in follow-up after 4 months but call us earlier if there are any cardiac problems. Patient said he will have complete blood tests at his PCP's office in March.   Despina Hick, MD, [facility] 08/03/2018, 12:07 [facility]. [place] Pager: [phone] Office: [phone] If no answer Cell [phone]

## 2018-08-05 NOTE — Progress Notes (Signed)
Subjective:  Primary Physician:  Leanna Battles, MD  Patient ID: Trevor Cooper., male    DOB: 1931/07/26, 85 y.o.   MRN: [id number]  This visit type was conducted due to national recommendations for restrictions regarding the COVID-19 Pandemic (e.g. social distancing).  This format is felt to be most appropriate for this patient at this time.  All issues noted in this document were discussed and addressed.  No physical exam was performed (except for noted visual exam findings with Telehealth visits - very limited).  The patient has consented to conduct a Telehealth visit and understands insurance will be billed.   I connected with patient, on 08/06/18  by telemedicine application and verified that I am speaking with the correct person using two identifiers.     I discussed the limitations of evaluation and management by telemedicine and the availability of in person appointments. The patient expressed understanding and agreed to proceed.   I have discussed with patient regarding the safety during COVID Pandemic and steps and precautions including social distancing with the patient.    Chief Complaint  Patient presents with  . Coronary Artery Disease    37mo  . Cardiac Valve Problem  . Follow-up    HPI: Trevor Cooper.  is a 85 y.o. male, who presents for a Follow-up for Coronary artery disease. Mr. Laubacher is 36 yrs. old white male. He is s/p IWMI in Dec, 2002, and s/p CABG x3- Dec. 2002 in [place]. Pt. recd. LIMA to LAD, SVG to OM, SVG to PDA. Lexiscan Myoview scans were negative for ischemia on 10/19/2014.  Patient has done fairly well since CABG. He denies any complaints of chest pain, tightness or pressure. No complaints of shortness of breath, orthopnea or PND. No palpitation, sudden heart racing or irregular heartbeat at any time. Patient has rare mild dizziness for a few seconds. No c/o near syncope or syncope. No c/o palpitation.   Patient has history of mild chronic  swelling on the left leg for few years, controlled with wearing compression stockings. There is no history of DVT. He did not have veins harvested from left leg for CABG. No pain in the left leg and no history of claudication.  Patient has hypertension. No history of diabetes. He has hypercholesterolemia. He does not smoke. He walks for half a mile or little more daily.  Patient has chronic kidney disease, stage III. No history of thyroid problems. No history of TIA or CVA. Patient is complaining of slight loss of memory, it has not changed in a long time. He also has bruising on the hands but there is no history of GI bleed, hematuria or any other bleeding.  Past Medical History:  Diagnosis Date  . Aortic regurgitation   . Chronic kidney disease   . Coronary artery disease   . GERD (gastroesophageal reflux disease)   . Heart murmur   . Hyperlipidemia   . Hypertension   . Myocardial infarction [facility])     History reviewed. No pertinent surgical history.  Social History   Socioeconomic History  . Marital status: Married    Spouse name: Not on file  . Number of children: 3  . Years of education: Not on file  . Highest education level: Not on file  Occupational History  . Not on file  Social Needs  . Financial resource strain: Not on file  . Food insecurity:    Worry: Not on file    Inability: Not on file  .  Transportation needs:    Medical: Not on file    Non-medical: Not on file  Tobacco Use  . Smoking status: Never Smoker  . Smokeless tobacco: Never Used  Substance and Sexual Activity  . Alcohol use: Yes    Comment: occasional  . Drug use: Not on file  . Sexual activity: Not on file  Lifestyle  . Physical activity:    Days per week: Not on file    Minutes per session: Not on file  . Stress: Not on file  Relationships  . Social connections:    Talks on phone: Not on file    Gets together: Not on file    Attends religious service: Not on file    Active member  of club or organization: Not on file    Attends meetings of clubs or organizations: Not on file    Relationship status: Not on file  . Intimate partner violence:    Fear of current or ex partner: Not on file    Emotionally abused: Not on file    Physically abused: Not on file    Forced sexual activity: Not on file  Other Topics Concern  . Not on file  Social History Narrative  . Not on file    Current Outpatient Medications on File Prior to Visit  Medication Sig Dispense Refill  . amLODipine (NORVASC) 5 MG tablet Take 5 mg by mouth daily.    Marland Kitchen aspirin EC 81 MG tablet Take 81 mg by mouth daily.    Marland Kitchen atorvastatin (LIPITOR) 80 MG tablet at bedtime.    . bisacodyl (BISACODYL) 5 MG EC tablet Take 5 mg by mouth daily.    . Cholecalciferol (VITAMIN D3) 25 MCG (1000 UT) CAPS Take by mouth daily.    Marland Kitchen ezetimibe (ZETIA) 10 MG tablet Take 10 mg by mouth daily.    . Ferrous Sulfate (IRON HIGH-POTENCY) 142 (45 Fe) MG TBCR Take by mouth daily.    . metoprolol tartrate (LOPRESSOR) 50 MG tablet Take 1 tablet (50 mg total) by mouth 2 (two) times daily. Take 1 tablet in the morning, and 1/2 tab in evening. (Patient taking differently: Take 50 mg by mouth. 50mg  in the morning, and 25mg  in evening.) 60 tablet 1  . mineral oil-hydrophilic petrolatum (AQUAPHOR) ointment Apply topically 3 (three) times daily.    . Nutritional Supplements (JUICE PLUS FIBRE PO) Take by mouth. 2 caps daily ([place])    . Nutritional Supplements (JUICE PLUS FIBRE PO) Take by mouth at bedtime. ([place])    . omeprazole (PRILOSEC) 20 MG capsule Take by mouth daily.    Marland Kitchen triamcinolone cream (KENALOG) 0.1 % Apply 1 application topically daily.    . vitamin C (ASCORBIC ACID) 250 MG tablet Take 250 mg by mouth daily.    . vitamin E 400 UNIT capsule Take 400 Units by mouth daily.     No current facility-administered medications on file prior to visit.     Review of Systems  Constitutional: Negative for fever.  HENT: Negative for  nosebleeds.   Eyes: Negative for blurred vision.  Respiratory: Negative for cough and shortness of breath.   Cardiovascular: Positive for leg swelling (mild chronic swelling of left leg). Negative for chest pain and palpitations.  Gastrointestinal: Negative for abdominal pain, nausea and vomiting.  Genitourinary: Negative for dysuria.  Musculoskeletal: Negative for myalgias.  Skin: Negative for itching and rash.  Neurological: Positive for dizziness (occasional mild dizziness). Negative for seizures and loss of consciousness.  Psychiatric/Behavioral:  The patient is not nervous/anxious.        Objective:  Blood pressure 129/73, height 5\' 10"  (1.778 m). There is no height or weight on file to calculate BMI.  Physical Exam: Patient is alert and oriented, appeared very comfortable talking to me during the visit. No further detailed physical examination was possible as it was a telemedicine visit.  CARDIAC STUDIES:  Lexiscan myoview stress test 10/19/2014: 1. The resting electrocardiogram demonstrated normal sinus rhythm, RBBB and no resting arrhythmias. Stress EKG is non-diagnostic for ischemia as it a pharmacologic stress using Lexiscan. Stress symptoms included dyspnea. 2. Myocardial perfusion imaging is normal. Overall left ventricular systolic function was normal without regional wall motion abnormalities. The left ventricular ejection fraction was 73%. Carotid Duplex- 07/29/2012- [place]- Mild plaque in the left bulb, proximal ICA and ECA. Mild to moderate calcific plaque in the right carotid bulb, no stenosis of greater than 50%. Normal right ICA and ECA. Antegrade flow in both the vertebrals. Echocardiogram 04/03/2018: Left ventricle cavity is normal in size. Moderate asymmetric hypertrophy of the left ventricle, septum measuring 1.4 cm, posterior wall measuring 1.2 cm. Normal global wall motion. Doppler evidence of grade I (impaired) diastolic dysfunction, normal LAP. Calculated EF 62%.  Left atrial cavity is moderately dilated. Trileaflet aortic valve with moderate annular and leaflet calcification. Aortic valve mean gradient of 24 mmHg, Vmax of 3.1 m/s. Calculated aortic valve area by continuity equation is 0.7 cm and dimensionless index of 0.24 suggest possibility of paradoxically low flow lo gradient severe aortic stenosis. Moderate (Grade III) regurgitation. Mild (Grade I) mitral regurgitation. Mild tricuspid regurgitation. Estimated pulmonary artery systolic pressure 29 mmHg. Mild pulmonic regurgitation. Compared to previous study on 10/25/2016, aortic stenosis has progressed in severity.  Assessment & Recommendations:   1. Atherosclerosis of native coronary artery of native heart without angina pectoris  2. Nonrheumatic aortic insufficiency with aortic stenosis  3. Essential hypertension  4. Hypercholesterolemia  5. CKD (chronic kidney disease) stage 3, GFR 30-59 ml/min (HCC)   Laboratory Exam: 05/29/2017-BUN-34, creatinine-1.6, GFR-41.5. Glucose-114, sodium-138, potassium-4.3 WBC-7.7, hemoglobin-11.8, hematocrit-35.5, platelets-205. Lipid Panel  05/01/2017-cholesterol-110, HDL-38, LDL-55, triglycerides-84. Normal liver enzymes.  Recommendation:  Cardiac status remains stable.Patient does not have angina or CHF.   His blood pressure is well controlled. Patient has been checking blood pressures regularly at home and it has remained in normal. I have advised him to continue present medications.  Chronic edema on the left leg is due to venous insufficiency, it has improved from before. I have advised him to continue to wear compression stockings all the time.  Secondary prevention was again explained. He was advised to follow low-salt, low-cholesterol diet and was encouraged to continue walking regularly.  I will see him in follow-up after 4 months but call us earlier if there are any cardiac problems. Patient will continue to have all blood tests at his  PCP's office.   Despina Hick, MD, [facility] 08/06/2018, 11:23 AM [facility]. [place] Pager: [phone] Office: [phone] If no answer Cell [phone]

## 2018-08-06 ENCOUNTER — Other Ambulatory Visit: Payer: Self-pay

## 2018-08-06 ENCOUNTER — Ambulatory Visit (INDEPENDENT_AMBULATORY_CARE_PROVIDER_SITE_OTHER): Payer: Medicare Other | Admitting: Cardiology

## 2018-08-06 ENCOUNTER — Encounter: Payer: Self-pay | Admitting: Cardiology

## 2018-08-06 VITALS — BP 129/73 | Ht 70.0 in

## 2018-08-06 DIAGNOSIS — N183 Chronic kidney disease, stage 3 unspecified: Secondary | ICD-10-CM

## 2018-08-06 DIAGNOSIS — K219 Gastro-esophageal reflux disease without esophagitis: Secondary | ICD-10-CM | POA: Insufficient documentation

## 2018-08-06 DIAGNOSIS — E7800 Pure hypercholesterolemia, unspecified: Secondary | ICD-10-CM

## 2018-08-06 DIAGNOSIS — I2510 Atherosclerotic heart disease of native coronary artery without angina pectoris: Secondary | ICD-10-CM | POA: Diagnosis not present

## 2018-08-06 DIAGNOSIS — I10 Essential (primary) hypertension: Secondary | ICD-10-CM | POA: Diagnosis not present

## 2018-08-06 DIAGNOSIS — I219 Acute myocardial infarction, unspecified: Secondary | ICD-10-CM | POA: Insufficient documentation

## 2018-08-06 DIAGNOSIS — I352 Nonrheumatic aortic (valve) stenosis with insufficiency: Secondary | ICD-10-CM | POA: Diagnosis not present

## 2018-10-24 DIAGNOSIS — Z85828 Personal history of other malignant neoplasm of skin: Secondary | ICD-10-CM | POA: Diagnosis not present

## 2018-10-24 DIAGNOSIS — L281 Prurigo nodularis: Secondary | ICD-10-CM | POA: Diagnosis not present

## 2018-10-24 DIAGNOSIS — C44619 Basal cell carcinoma of skin of left upper limb, including shoulder: Secondary | ICD-10-CM | POA: Diagnosis not present

## 2018-10-24 DIAGNOSIS — C44622 Squamous cell carcinoma of skin of right upper limb, including shoulder: Secondary | ICD-10-CM | POA: Diagnosis not present

## 2018-10-24 DIAGNOSIS — C44629 Squamous cell carcinoma of skin of left upper limb, including shoulder: Secondary | ICD-10-CM | POA: Diagnosis not present

## 2018-10-24 DIAGNOSIS — D0462 Carcinoma in situ of skin of left upper limb, including shoulder: Secondary | ICD-10-CM | POA: Diagnosis not present

## 2018-10-24 DIAGNOSIS — D0461 Carcinoma in situ of skin of right upper limb, including shoulder: Secondary | ICD-10-CM | POA: Diagnosis not present

## 2018-10-24 DIAGNOSIS — L570 Actinic keratosis: Secondary | ICD-10-CM | POA: Diagnosis not present

## 2018-10-31 ENCOUNTER — Other Ambulatory Visit: Payer: Self-pay

## 2018-10-31 MED ORDER — ATORVASTATIN CALCIUM 80 MG PO TABS: 80.0000 mg | ORAL_TABLET | Freq: Every day | ORAL | 1 refills | Status: DC

## 2018-12-04 ENCOUNTER — Other Ambulatory Visit: Payer: Self-pay

## 2018-12-04 DIAGNOSIS — I219 Acute myocardial infarction, unspecified: Secondary | ICD-10-CM

## 2018-12-04 MED ORDER — EZETIMIBE 10 MG PO TABS: 10.0000 mg | ORAL_TABLET | Freq: Every day | ORAL | 3 refills | Status: AC

## 2018-12-07 DIAGNOSIS — Z23 Encounter for immunization: Secondary | ICD-10-CM | POA: Diagnosis not present

## 2018-12-09 ENCOUNTER — Ambulatory Visit: Payer: Medicare Other | Admitting: Cardiology

## 2018-12-13 ENCOUNTER — Encounter: Payer: Self-pay | Admitting: Cardiology

## 2018-12-13 ENCOUNTER — Other Ambulatory Visit: Payer: Self-pay

## 2018-12-13 ENCOUNTER — Ambulatory Visit (INDEPENDENT_AMBULATORY_CARE_PROVIDER_SITE_OTHER): Payer: Medicare Other | Admitting: Cardiology

## 2018-12-13 VITALS — BP 152/70 | HR 71 | Temp 96.9°F | Ht 70.0 in | Wt 161.0 lb

## 2018-12-13 DIAGNOSIS — I352 Nonrheumatic aortic (valve) stenosis with insufficiency: Secondary | ICD-10-CM

## 2018-12-13 DIAGNOSIS — I10 Essential (primary) hypertension: Secondary | ICD-10-CM

## 2018-12-13 DIAGNOSIS — R0989 Other specified symptoms and signs involving the circulatory and respiratory systems: Secondary | ICD-10-CM | POA: Insufficient documentation

## 2018-12-13 DIAGNOSIS — I25810 Atherosclerosis of coronary artery bypass graft(s) without angina pectoris: Secondary | ICD-10-CM | POA: Diagnosis not present

## 2018-12-13 DIAGNOSIS — R600 Localized edema: Secondary | ICD-10-CM | POA: Diagnosis not present

## 2018-12-13 MED ORDER — FUROSEMIDE 20 MG PO TABS: 20.0000 mg | ORAL_TABLET | Freq: Every day | ORAL | 3 refills | Status: DC

## 2018-12-13 NOTE — Progress Notes (Signed)
Follow up visit  Subjective:   Trevor Cooper., male    DOB: [birth year], 85 y.o.   MRN: [id number]   Chief Complaint  Patient presents with  . Coronary Artery Disease  . Follow-up    4 month    HPI  85 y/o Caucasian male with hypertension, CKD 3, CAD MI 2002, CABGX3 (LIMA-LAD, SVG-OM, SVG-PDA-2002 in [place]), at least mod AS, mod AI.  Patient lives with his wife in retirement living community in [place]. He is quite independent and able to perform all his ADL's-including driving, without any difficulty. He walks about 30 min 3 days a week without any chest pain, shortness of breath. He has had left lower leg swelling for a long time, that has attributed to possible venous insufficiency. He reports brief episodes of lightheadedness lasting for about 30 seconds. These episodes occur at rest and never with exertion. He has not had any syncopal episodes.  His echocardiogram in 03/2018 did show progression of his aortic stenosis severity. While it did not meet mean PG and vel criteria for severe AS, his AVA was 0.7 cm2-raising the possibility of paradoxically low flow low gradient aortic stenosis.    Past Medical History:  Diagnosis Date  . Aortic regurgitation   . Chronic kidney disease   . Coronary artery disease   . GERD (gastroesophageal reflux disease)   . Heart murmur   . Hyperlipidemia   . Hypertension   . Myocardial infarction [facility])      Past Surgical History:  Procedure Laterality Date  . CORONARY ARTERY BYPASS GRAFT  2001    Social History   Socioeconomic History  . Marital status: Married    Spouse name: Not on file  . Number of children: 3  . Years of education: Not on file  . Highest education level: Not on file  Occupational History  . Not on file  Social Needs  . Financial resource strain: Not on file  . Food insecurity    Worry: Not on file    Inability: Not on file  . Transportation needs    Medical: Not on file    Non-medical: Not on  file  Tobacco Use  . Smoking status: Never Smoker  . Smokeless tobacco: Never Used  Substance and Sexual Activity  . Alcohol use: Yes    Comment: occasional  . Drug use: Not on file  . Sexual activity: Not on file  Lifestyle  . Physical activity    Days per week: Not on file    Minutes per session: Not on file  . Stress: Not on file  Relationships  . Social Herbalist on phone: Not on file    Gets together: Not on file    Attends religious service: Not on file    Active member of club or organization: Not on file    Attends meetings of clubs or organizations: Not on file    Relationship status: Not on file  . Intimate partner violence    Fear of current or ex partner: Not on file    Emotionally abused: Not on file    Physically abused: Not on file    Forced sexual activity: Not on file  Other Topics Concern  . Not on file  Social History Narrative  . Not on file     Family History  Problem Relation Age of Onset  . Heart failure Father      Current Outpatient Medications on File Prior to Visit  Medication Sig Dispense Refill  . amLODipine (NORVASC) 5 MG tablet Take 5 mg by mouth daily.    Marland Kitchen aspirin EC 81 MG tablet Take 81 mg by mouth daily.    Marland Kitchen atorvastatin (LIPITOR) 80 MG tablet Take 1 tablet (80 mg total) by mouth at bedtime. 90 tablet 1  . bisacodyl (BISACODYL) 5 MG EC tablet Take 5 mg by mouth daily.    . Cholecalciferol (VITAMIN D3) 25 MCG (1000 UT) CAPS Take by mouth daily.    Marland Kitchen ezetimibe (ZETIA) 10 MG tablet Take 1 tablet (10 mg total) by mouth daily. 90 tablet 3  . Ferrous Sulfate (IRON HIGH-POTENCY) 142 (45 Fe) MG TBCR Take by mouth daily.    . metoprolol tartrate (LOPRESSOR) 50 MG tablet Take 1 tablet (50 mg total) by mouth 2 (two) times daily. Take 1 tablet in the morning, and 1/2 tab in evening. (Patient taking differently: Take 50 mg by mouth. 40m in the morning, and 244min evening.) 60 tablet 1  . mineral oil-hydrophilic petrolatum  (AQUAPHOR) ointment Apply topically 3 (three) times daily.    . Nutritional Supplements (JUICE PLUS FIBRE PO) Take by mouth. 2 caps daily (Orchard)    . Nutritional Supplements (JUICE PLUS FIBRE PO) Take by mouth at bedtime. (Garden)    . omeprazole (PRILOSEC) 20 MG capsule Take by mouth daily.    . Marland Kitchenriamcinolone cream (KENALOG) 0.1 % Apply 1 application topically daily.    . vitamin C (ASCORBIC ACID) 250 MG tablet Take 250 mg by mouth daily.    . vitamin E 400 UNIT capsule Take 400 Units by mouth daily.     No current facility-administered medications on file prior to visit.     Cardiovascular studies:  EKG 12/13/2018: Sinus rhythm 65 bpm. Right bundle branch block.   Echocardiogram 04/03/2018: Left ventricle cavity is normal in size. Moderate asymmetric hypertrophy of the left ventricle, septum measuring 1.4 cm, posterior wall measuring 1.2 cm.  Normal global wall motion. Doppler evidence of grade I (impaired) diastolic dysfunction, normal LAP. Calculated EF 62%. Left atrial cavity is moderately dilated. Trileaflet aortic valve with moderate annular and leaflet calcification. Aortic valve mean gradient of 24 mmHg, Vmax of 3.1  m/s. Calculated aortic valve area by continuity equation is 0.7 cm and dimensionless index of 0.24 suggest possibility of paradoxically low flow lo gradient severe aortic stenosis. Moderate (Grade III) regurgitation. Mild (Grade I) mitral regurgitation. Mild tricuspid regurgitation. Estimated pulmonary artery systolic pressure 29 mmHg. Mild pulmonic regurgitation. Compared to previous study on 10/25/2016, aortic stenosis has progressed in severity.    Recent labs: 05/2017: Glucose 116. BUN/Cr 24/1.6. eGFR 41. Na/K 138/4.3. H/H 11.8/35.5 Platelets 205.  Lipid panel 04/2017: Chol 110, TG 55, HDL 38, LDL 55    Review of Systems  Constitution: Negative for decreased appetite, malaise/fatigue, weight gain and weight loss.  HENT: Negative for congestion.   Eyes:  Negative for visual disturbance.  Cardiovascular: Positive for leg swelling. Negative for chest pain, dyspnea on exertion, palpitations and syncope.  Respiratory: Negative for cough.   Endocrine: Negative for cold intolerance.  Hematologic/Lymphatic: Does not bruise/bleed easily.  Skin: Negative for itching and rash.  Musculoskeletal: Negative for myalgias.  Gastrointestinal: Negative for abdominal pain, nausea and vomiting.  Genitourinary: Negative for dysuria.  Neurological: Positive for light-headedness. Negative for dizziness and weakness.  Psychiatric/Behavioral: The patient is not nervous/anxious.   All other systems reviewed and are negative.        Vitals:   12/13/18 1127  BP: (!) 152/70  Pulse: 71  Temp: (!) 96.9 F (36.1 C)  SpO2: 99%    Body mass index is 23.1 kg/m. Filed Weights   12/13/18 1127  Weight: 161 lb (73 kg)     Objective:   Physical Exam  Constitutional: He is oriented to person, place, and time. He appears well-developed and well-nourished. No distress.  HENT:  Head: Normocephalic and atraumatic.  Eyes: Pupils are equal, round, and reactive to light. Conjunctivae are normal.  Neck: No JVD present.  Cardiovascular: Normal rate, regular rhythm and intact distal pulses.  Murmur heard.  Harsh crescendo-decrescendo midsystolic murmur is present with a grade of 3/6 at the upper right sternal border radiating to the neck. Pulses:      Carotid pulses are on the right side with bruit and on the left side with bruit. Pulmonary/Chest: Effort normal and breath sounds normal. He has no wheezes. He has no rales.  Abdominal: Soft. Bowel sounds are normal. There is no rebound.  Musculoskeletal:        General: No edema.  Lymphadenopathy:    He has no cervical adenopathy.  Neurological: He is alert and oriented to person, place, and time. No cranial nerve deficit.  Skin: Skin is warm and dry.  Psychiatric: He has a normal mood and affect.  Nursing note  and vitals reviewed.         Assessment & Recommendations:   85 y/o Caucasian male with hypertension, CKD 3, CAD MI 2002, CABGX3 (LIMA-LAD, SVG-OM, SVG-PDA-2002 in [place]), at least mod AS, mod AI.  AS, AR: Both at least moderate. AS could well be paradoxically low flow low gradient.  Other than leg swelling, he is relatively asymptomatic.  I will repeat echocardiogram and check CBC, BMP. Started los dose lasix 20 mg daily. If echocardiogram shows severe AS and BNP is normal, I will perform exercise treadmill stress test.   Carotid bruit: Likely conducted murmur from AS. Will check carotid US.  CAD s/p CABG: Continue Aspirin, statin, metoprolol succinate- he currently takes 50 mg in am and 25 mg in pm and tolerating well.  Hypertension: BP much lower on home checks. Added lasix as above. No other change made.   Nigel Mormon, MD [facility] [place] Pager: [phone] Office: [phone] If no answer Cell [phone]

## 2018-12-26 ENCOUNTER — Other Ambulatory Visit: Payer: Self-pay

## 2018-12-26 ENCOUNTER — Ambulatory Visit (INDEPENDENT_AMBULATORY_CARE_PROVIDER_SITE_OTHER): Payer: Medicare Other

## 2018-12-26 ENCOUNTER — Other Ambulatory Visit (HOSPITAL_COMMUNITY): Payer: Self-pay | Admitting: Cardiology

## 2018-12-26 DIAGNOSIS — R0989 Other specified symptoms and signs involving the circulatory and respiratory systems: Secondary | ICD-10-CM | POA: Diagnosis not present

## 2018-12-26 DIAGNOSIS — R600 Localized edema: Secondary | ICD-10-CM | POA: Diagnosis not present

## 2018-12-26 DIAGNOSIS — I352 Nonrheumatic aortic (valve) stenosis with insufficiency: Secondary | ICD-10-CM

## 2018-12-27 LAB — BASIC METABOLIC PANEL
BUN/Creatinine Ratio: 23 (ref 10–24)
BUN: 54 mg/dL — ABNORMAL HIGH (ref 8–27)
CO2: 22 mmol/L (ref 20–29)
Calcium: 9.6 mg/dL (ref 8.6–10.2)
Chloride: 101 mmol/L (ref 96–106)
Creatinine, Ser: 2.36 mg/dL — ABNORMAL HIGH (ref 0.76–1.27)
GFR calc Af Amer: 28 mL/min/{1.73_m2} — ABNORMAL LOW (ref >59)
GFR calc non Af Amer: 24 mL/min/{1.73_m2} — ABNORMAL LOW (ref >59)
Glucose: 101 mg/dL — ABNORMAL HIGH (ref 65–99)
Potassium: 5.3 mmol/L — ABNORMAL HIGH (ref 3.5–5.2)
Sodium: 139 mmol/L (ref 134–144)

## 2018-12-27 LAB — CBC
Hematocrit: 34 % — ABNORMAL LOW (ref 37.5–51.0)
Hemoglobin: 11.3 g/dL — ABNORMAL LOW (ref 13.0–17.7)
MCH: 30.7 pg (ref 26.6–33.0)
MCHC: 33.2 g/dL (ref 31.5–35.7)
MCV: 92 fL (ref 79–97)
Platelets: 179 10*3/uL (ref 150–450)
RBC: 3.68 x10E6/uL — ABNORMAL LOW (ref 4.14–5.80)
RDW: 12.5 % (ref 11.6–15.4)
WBC: 7.7 10*3/uL (ref 3.4–10.8)

## 2018-12-27 LAB — BRAIN NATRIURETIC PEPTIDE: BNP: 100.1 pg/mL — ABNORMAL HIGH (ref 0.0–100.0)

## 2018-12-27 NOTE — Progress Notes (Signed)
Reviewed and discussed BMP and echocardiogram findings with the patient and his wife. Needs Nephrology referral. Keep appt with me on 10/15. His daughter-nurse-will be joining for further discussion on that day.

## 2018-12-29 ENCOUNTER — Other Ambulatory Visit: Payer: Self-pay | Admitting: Cardiology

## 2018-12-29 DIAGNOSIS — I6523 Occlusion and stenosis of bilateral carotid arteries: Secondary | ICD-10-CM

## 2018-12-29 NOTE — Progress Notes (Signed)
Follow up visit  Subjective:   Trevor Cooper., male    DOB: 02-Jun-1931, 85 y.o.   MRN: [id number]   Chief Complaint  Patient presents with  . Hypertension  . Coronary Artery Disease  . Aortic Stenosis  . Aortic Insuffiency    HPI  85 y/o Caucasian male with hypertension, CKD 3, CAD MI 2002, CABGX3 (LIMA-LAD, SVG-OM, SVG-PDA-2002 in [place]), at least mod AS, mod AI.  Patient lives with his wife in retirement living community in [place]. He is quite independent and able to perform all his ADL's-including driving, without any difficulty. He walks about 30 min 3 days a week without any chest pain, shortness of breath. He has had left lower leg swelling for a long time, that has attributed to possible venous insufficiency. He reports brief episodes of lightheadedness lasting for about 30 seconds. These episodes occur at rest and never with exertion. He has not had any syncopal episodes.  His echocardiogram in 12/2018 did show progression of his aortic stenosis severity-now moderate to severe, along with moderate aortic insufficiency.  BNP is borderline at 100.1.  More importantly, his creatinine is increased from 1.7 in March 2020 at 2.3.   Patient continues to have fairly good functional capacity, and exercise regularly at [employer].  He denies any obvious chest pain, shortness of breath, syncope.  He has had stable swelling in his left leg for several years without any change.  Past Medical History:  Diagnosis Date  . Aortic regurgitation   . Chronic kidney disease   . Coronary artery disease   . GERD (gastroesophageal reflux disease)   . Heart murmur   . Hyperlipidemia   . Hypertension   . Myocardial infarction [facility])      Past Surgical History:  Procedure Laterality Date  . CORONARY ARTERY BYPASS GRAFT  2001    Social History   Socioeconomic History  . Marital status: Married    Spouse name: Not on file  . Number of children: 3  . Years  of education: Not on file  . Highest education level: Not on file  Occupational History  . Not on file  Social Needs  . Financial resource strain: Not on file  . Food insecurity    Worry: Not on file    Inability: Not on file  . Transportation needs    Medical: Not on file    Non-medical: Not on file  Tobacco Use  . Smoking status: Never Smoker  . Smokeless tobacco: Never Used  Substance and Sexual Activity  . Alcohol use: Yes    Comment: occasional  . Drug use: Not on file  . Sexual activity: Not on file  Lifestyle  . Physical activity    Days per week: Not on file    Minutes per session: Not on file  . Stress: Not on file  Relationships  . Social Herbalist on phone: Not on file    Gets together: Not on file    Attends religious service: Not on file    Active member of club or organization: Not on file    Attends meetings of clubs or organizations: Not on file    Relationship status: Not on file  . Intimate partner violence    Fear of current or ex partner: Not on file    Emotionally abused: Not on file    Physically abused: Not on file    Forced sexual activity: Not on file  Other Topics  Concern  . Not on file  Social History Narrative  . Not on file     Family History  Problem Relation Age of Onset  . Heart failure Father      Current Outpatient Medications on File Prior to Visit  Medication Sig Dispense Refill  . amLODipine (NORVASC) 5 MG tablet Take 5 mg by mouth daily.    Marland Kitchen aspirin EC 81 MG tablet Take 81 mg by mouth daily.    Marland Kitchen atorvastatin (LIPITOR) 80 MG tablet Take 1 tablet (80 mg total) by mouth at bedtime. 90 tablet 1  . bisacodyl (BISACODYL) 5 MG EC tablet Take 5 mg by mouth daily.    . Cholecalciferol (VITAMIN D3) 25 MCG (1000 UT) CAPS Take by mouth daily.    Marland Kitchen ezetimibe (ZETIA) 10 MG tablet Take 1 tablet (10 mg total) by mouth daily. 90 tablet 3  . Ferrous Sulfate (IRON HIGH-POTENCY) 142 (45 Fe) MG TBCR Take by mouth daily.    .  furosemide (LASIX) 20 MG tablet Take 1 tablet (20 mg total) by mouth daily. 30 tablet 3  . metoprolol tartrate (LOPRESSOR) 50 MG tablet Take 1 tablet (50 mg total) by mouth 2 (two) times daily. Take 1 tablet in the morning, and 1/2 tab in evening. (Patient taking differently: Take 50 mg by mouth. 52m in the morning, and 23min evening.) 60 tablet 1  . mineral oil-hydrophilic petrolatum (AQUAPHOR) ointment Apply topically 3 (three) times daily.    . Nutritional Supplements (JUICE PLUS FIBRE PO) Take by mouth. 2 caps daily ([facility])    . Nutritional Supplements (JUICE PLUS FIBRE PO) Take by mouth at bedtime. (Garden)    . omeprazole (PRILOSEC) 20 MG capsule Take by mouth daily.    . Marland Kitchenriamcinolone cream (KENALOG) 0.1 % Apply 1 application topically daily.    . vitamin C (ASCORBIC ACID) 250 MG tablet Take 250 mg by mouth daily.    . vitamin E 400 UNIT capsule Take 400 Units by mouth daily.     No current facility-administered medications on file prior to visit.     Cardiovascular studies:  Echocardiogram 12/26/2018: Left ventricle cavity is normal in size. Moderate concentric hypertrophy of the left ventricle. Normal LV systolic function with EF 55%. Normal global wall motion. Doppler evidence of grade I (impaired) diastolic dysfunction, normal LAP. Calculated EF 55%. Left atrial cavity is mildly dilated. Trileaflet aortic valve with mild calcification of the aortic valve annulus, moderate aortic valve leaflet calcification. Moderate to severe aortic valve stenosis. Aortic valve mean gradient of 29 mmHg, Vmax of 3.4  m/s. Calculated aortic valve area by continuity equation is 0.8 cm. DVI of 023 suggests possibility of paradoxically low flow low gradient aortic stenosis. Moderate (grade III) aortic regurgitation.  Mild tricuspid regurgitation. Estimated pulmonary artery systolic pressure is 26 mmHg. Compared to previous study on 04/03/2018, there is interval progression of aortic stenosis severity.    Carotid artery duplex  12/26/2018: Stenosis in the right internal carotid artery (16-49%).  Stenosis in the right external carotid artery (<50%). Minimal plaque in the left internal carotid artery (1-15%).  Stenosis in the left external carotid artery (<50%). Antegrade right vertebral artery flow.  Follow up in one year is appropriate if clinically indicated.  EKG 12/13/2018: Sinus rhythm 65 bpm. Right bundle branch block.    Recent labs: 05/2017: Glucose 116. BUN/Cr 24/1.6. eGFR 41. Na/K 138/4.3. H/H 11.8/35.5 Platelets 205.   Lipid panel 04/2017: Chol 110, TG 55, HDL 38, LDL 55  Review of Systems  Constitution: Negative for decreased appetite, malaise/fatigue, weight gain and weight loss.  HENT: Negative for congestion.   Eyes: Negative for visual disturbance.  Cardiovascular: Positive for leg swelling. Negative for chest pain, dyspnea on exertion, palpitations and syncope.  Respiratory: Negative for cough.   Endocrine: Negative for cold intolerance.  Hematologic/Lymphatic: Does not bruise/bleed easily.  Skin: Negative for itching and rash.  Musculoskeletal: Negative for myalgias.  Gastrointestinal: Negative for abdominal pain, nausea and vomiting.  Genitourinary: Negative for dysuria.  Neurological: Positive for light-headedness. Negative for dizziness and weakness.  Psychiatric/Behavioral: The patient is not nervous/anxious.   All other systems reviewed and are negative.       Vitals:   01/02/19 1038  BP: (!) 154/69  Pulse: 71  Temp: 97.7 F (36.5 C)  SpO2: 99%     Body mass index is 22.38 kg/m. Filed Weights   01/02/19 1038  Weight: 156 lb (70.8 kg)     Objective:   Physical Exam  Constitutional: He is oriented to person, place, and time. He appears well-developed and well-nourished. No distress.  HENT:  Head: Normocephalic and atraumatic.  Eyes: Pupils are equal, round, and reactive to light. Conjunctivae are normal.  Neck: No JVD present.   Cardiovascular: Normal rate, regular rhythm and intact distal pulses.  Murmur heard.  Harsh crescendo-decrescendo midsystolic murmur is present with a grade of 3/6 at the upper right sternal border radiating to the neck. Pulses:      Carotid pulses are on the right side with bruit and on the left side with bruit. Pulmonary/Chest: Effort normal and breath sounds normal. He has no wheezes. He has no rales.  Abdominal: Soft. Bowel sounds are normal. There is no rebound.  Musculoskeletal:        General: No edema.  Lymphadenopathy:    He has no cervical adenopathy.  Neurological: He is alert and oriented to person, place, and time. No cranial nerve deficit.  Skin: Skin is warm and dry.  Psychiatric: He has a normal mood and affect.  Nursing note and vitals reviewed.         Assessment & Recommendations:   85 y/o Caucasian male with hypertension, CKD 3, CAD MI 2002, CABGX3 (LIMA-LAD, SVG-OM, SVG-PDA-2002 in [place]), at least mod AS, mod AI.  AS, AR: Both at least moderate. AS could well be paradoxically low flow low gradient. Other than leg swelling, he is relatively asymptomatic.  A long conversation with patient, his wife, and daughter Loss adjuster, chartered) regarding further management plans.  Patient and wife are planning to go to [place] from December to March for their annual trip to their property.  They are anticipating certain financial deals that they need to be there in person for.  They are worried about his diagnosis and further management options.  At this time, his management is complicated by acute worsening of his renal function.  As such, he is asymptomatic from his moderate to severe aortic stenosis and insufficiency.  I will perform a modified Bruce protocol exercise treadmill stress test to stratify his aortic stenosis.  If normal stress test, he should have repeat echocardiogram in 6 months.  If abnormal stress test, we may need to expedite work-up for possible TAVR.  Renal function  needs to be closely monitored.  I will refer him to nephrology for further management.  At this time, I have held his Lasix for any improvement in his creatinine.  We will repeat BMP next week.  CAD s/p CABG: Continue  Aspirin, statin, metoprolol succinate- he currently takes 50 mg in am and 25 mg in pm and tolerating well.  Hypertension: Fairly well controlled.  Total time spent with patient was 40 minutes and greater than 50% of that time was spent in counseling and coordination care with the patient regarding complex decision making and discussion as state above.   Nigel Mormon, MD [facility] [place] Pager: [phone] Office: [phone] If no answer Cell [phone]

## 2019-01-02 ENCOUNTER — Ambulatory Visit (INDEPENDENT_AMBULATORY_CARE_PROVIDER_SITE_OTHER): Payer: Medicare Other | Admitting: Cardiology

## 2019-01-02 ENCOUNTER — Other Ambulatory Visit: Payer: Self-pay

## 2019-01-02 ENCOUNTER — Encounter: Payer: Self-pay | Admitting: Cardiology

## 2019-01-02 VITALS — BP 154/69 | HR 71 | Temp 97.7°F | Ht 70.0 in | Wt 156.0 lb

## 2019-01-02 DIAGNOSIS — I25810 Atherosclerosis of coronary artery bypass graft(s) without angina pectoris: Secondary | ICD-10-CM

## 2019-01-02 DIAGNOSIS — N184 Chronic kidney disease, stage 4 (severe): Secondary | ICD-10-CM

## 2019-01-02 DIAGNOSIS — I6523 Occlusion and stenosis of bilateral carotid arteries: Secondary | ICD-10-CM

## 2019-01-02 DIAGNOSIS — I352 Nonrheumatic aortic (valve) stenosis with insufficiency: Secondary | ICD-10-CM

## 2019-01-06 ENCOUNTER — Ambulatory Visit (INDEPENDENT_AMBULATORY_CARE_PROVIDER_SITE_OTHER): Payer: Medicare Other

## 2019-01-06 ENCOUNTER — Other Ambulatory Visit: Payer: Self-pay

## 2019-01-06 DIAGNOSIS — I352 Nonrheumatic aortic (valve) stenosis with insufficiency: Secondary | ICD-10-CM

## 2019-01-06 DIAGNOSIS — I25810 Atherosclerosis of coronary artery bypass graft(s) without angina pectoris: Secondary | ICD-10-CM | POA: Diagnosis not present

## 2019-01-07 NOTE — Progress Notes (Signed)
Called pt to inform him about his stress test. Pt understood

## 2019-01-13 DIAGNOSIS — N184 Chronic kidney disease, stage 4 (severe): Secondary | ICD-10-CM | POA: Diagnosis not present

## 2019-01-14 ENCOUNTER — Telehealth: Payer: Self-pay

## 2019-01-14 LAB — BASIC METABOLIC PANEL
BUN/Creatinine Ratio: 20 (ref 10–24)
BUN: 40 mg/dL — ABNORMAL HIGH (ref 8–27)
CO2: 24 mmol/L (ref 20–29)
Calcium: 9.7 mg/dL (ref 8.6–10.2)
Chloride: 100 mmol/L (ref 96–106)
Creatinine, Ser: 2.02 mg/dL — ABNORMAL HIGH (ref 0.76–1.27)
GFR calc Af Amer: 34 mL/min/{1.73_m2} — ABNORMAL LOW (ref >59)
GFR calc non Af Amer: 29 mL/min/{1.73_m2} — ABNORMAL LOW (ref >59)
Glucose: 85 mg/dL (ref 65–99)
Potassium: 5.3 mmol/L — ABNORMAL HIGH (ref 3.5–5.2)
Sodium: 138 mmol/L (ref 134–144)

## 2019-01-14 NOTE — Telephone Encounter (Signed)
I faxed Blood work results to [facility] @ [phone], for pt's appt with Dr. Moshe Cipro 01/27/19.  I also faxed results to patient @ [phone]

## 2019-01-17 NOTE — Progress Notes (Signed)
Gave wife results and also confirmed that he had received lab results we faxed. She verbalized understanding.

## 2019-01-17 NOTE — Progress Notes (Signed)
Called pt, pt wife answered was informed about pt results

## 2019-01-26 NOTE — Progress Notes (Deleted)
Follow up visit  Subjective:   Trevor Cooper., male    DOB: [birth year], 85 y.o.   MRN: [id number]  *** No chief complaint on file.   HPI  85 y/o Caucasian male with hypertension, CKD 3, CAD MI 2002, CABGX3 (LIMA-LAD, SVG-OM, SVG-PDA-2002 in [place]), at least mod AS, mod AI.   ***  Patient lives with his wife in retirement living community in [place]. He is quite independent and able to perform all his ADL's-including driving, without any difficulty. He walks about 30 min 3 days a week without any chest pain, shortness of breath. He has had left lower leg swelling for a long time, that has attributed to possible venous insufficiency. He reports brief episodes of lightheadedness lasting for about 30 seconds. These episodes occur at rest and never with exertion. He has not had any syncopal episodes.  His echocardiogram in 12/2018 did show progression of his aortic stenosis severity-now moderate to severe, along with moderate aortic insufficiency.  BNP is borderline at 100.1.  More importantly, his creatinine is increased from 1.7 in March 2020 at 2.3.   Patient continues to have fairly good functional capacity, and exercise regularly at [employer].  He denies any obvious chest pain, shortness of breath, syncope.  He has had stable swelling in his left leg for several years without any change.  Past Medical History:  Diagnosis Date  . Aortic regurgitation   . Chronic kidney disease   . Coronary artery disease   . GERD (gastroesophageal reflux disease)   . Heart murmur   . Hyperlipidemia   . Hypertension   . Myocardial infarction [facility])      Past Surgical History:  Procedure Laterality Date  . CORONARY ARTERY BYPASS GRAFT  2001    Social History   Socioeconomic History  . Marital status: Married    Spouse name: Not on file  . Number of children: 3  . Years of education: Not on file  . Highest education level: Not on file  Occupational History   . Not on file  Social Needs  . Financial resource strain: Not on file  . Food insecurity    Worry: Not on file    Inability: Not on file  . Transportation needs    Medical: Not on file    Non-medical: Not on file  Tobacco Use  . Smoking status: Never Smoker  . Smokeless tobacco: Never Used  Substance and Sexual Activity  . Alcohol use: Yes    Comment: occasional  . Drug use: Not on file  . Sexual activity: Not on file  Lifestyle  . Physical activity    Days per week: Not on file    Minutes per session: Not on file  . Stress: Not on file  Relationships  . Social Herbalist on phone: Not on file    Gets together: Not on file    Attends religious service: Not on file    Active member of club or organization: Not on file    Attends meetings of clubs or organizations: Not on file    Relationship status: Not on file  . Intimate partner violence    Fear of current or ex partner: Not on file    Emotionally abused: Not on file    Physically abused: Not on file    Forced sexual activity: Not on file  Other Topics Concern  . Not on file  Social History Narrative  . Not on file  Family History  Problem Relation Age of Onset  . Heart failure Father      Current Outpatient Medications on File Prior to Visit  Medication Sig Dispense Refill  . amLODipine (NORVASC) 5 MG tablet Take 5 mg by mouth daily.    Marland Kitchen aspirin EC 81 MG tablet Take 81 mg by mouth daily.    Marland Kitchen atorvastatin (LIPITOR) 80 MG tablet Take 1 tablet (80 mg total) by mouth at bedtime. 90 tablet 1  . bisacodyl (BISACODYL) 5 MG EC tablet Take 5 mg by mouth daily.    . Cholecalciferol (VITAMIN D3) 25 MCG (1000 UT) CAPS Take by mouth daily.    Marland Kitchen ezetimibe (ZETIA) 10 MG tablet Take 1 tablet (10 mg total) by mouth daily. 90 tablet 3  . Ferrous Sulfate (IRON HIGH-POTENCY) 142 (45 Fe) MG TBCR Take by mouth daily.    . metoprolol tartrate (LOPRESSOR) 50 MG tablet Take 1 tablet (50 mg total) by mouth 2 (two)  times daily. Take 1 tablet in the morning, and 1/2 tab in evening. (Patient taking differently: Take 50 mg by mouth. 20m in the morning, and 278min evening.) 60 tablet 1  . mineral oil-hydrophilic petrolatum (AQUAPHOR) ointment Apply topically 3 (three) times daily.    . Nutritional Supplements (JUICE PLUS FIBRE PO) Take by mouth at bedtime. (Garden)    . omeprazole (PRILOSEC) 20 MG capsule Take by mouth daily.    . Marland Kitchenriamcinolone cream (KENALOG) 0.1 % Apply 1 application topically daily.    . vitamin C (ASCORBIC ACID) 250 MG tablet Take 250 mg by mouth daily.    . vitamin E 400 UNIT capsule Take 400 Units by mouth daily.     No current facility-administered medications on file prior to visit.     Cardiovascular studies:  Exercise treadmill stress test 01/06/2019: Exercise treadmill stress test performed using modified Bruce protocol.  Patient reached 2.6 METS, and 107% of age predicted maximum heart rate.  Exercise capacity was low.  No chest pain reported. Normal heart rare response. BP response was flat with no significant increase with low level exercise.  Stress EKG revealed no ischemic changes. Relatively unchanged blood pressure response with exercise without presyncope, syncope, or ischemic EKG changes. Intermediate risk study. Recommend clinical correlation.    Echocardiogram 12/26/2018: Left ventricle cavity is normal in size. Moderate concentric hypertrophy of the left ventricle. Normal LV systolic function with EF 55%. Normal global wall motion. Doppler evidence of grade I (impaired) diastolic dysfunction, normal LAP. Calculated EF 55%. Left atrial cavity is mildly dilated. Trileaflet aortic valve with mild calcification of the aortic valve annulus, moderate aortic valve leaflet calcification. Moderate to severe aortic valve stenosis. Aortic valve mean gradient of 29 mmHg, Vmax of 3.4  m/s. Calculated aortic valve area by continuity equation is 0.8 cm. DVI of 023 suggests  possibility of paradoxically low flow low gradient aortic stenosis. Moderate (grade III) aortic regurgitation.  Mild tricuspid regurgitation. Estimated pulmonary artery systolic pressure is 26 mmHg. Compared to previous study on 04/03/2018, there is interval progression of aortic stenosis severity.   Carotid artery duplex  12/26/2018: Stenosis in the right internal carotid artery (16-49%).  Stenosis in the right external carotid artery (<50%). Minimal plaque in the left internal carotid artery (1-15%).  Stenosis in the left external carotid artery (<50%). Antegrade right vertebral artery flow.  Follow up in one year is appropriate if clinically indicated.  EKG 12/13/2018: Sinus rhythm 65 bpm. Right bundle branch block.    Recent labs:  05/2017: Glucose 116. BUN/Cr 24/1.6. eGFR 41. Na/K 138/4.3. H/H 11.8/35.5 Platelets 205.   Lipid panel 04/2017: Chol 110, TG 55, HDL 38, LDL 55    Review of Systems  Constitution: Negative for decreased appetite, malaise/fatigue, weight gain and weight loss.  HENT: Negative for congestion.   Eyes: Negative for visual disturbance.  Cardiovascular: Positive for leg swelling. Negative for chest pain, dyspnea on exertion, palpitations and syncope.  Respiratory: Negative for cough.   Endocrine: Negative for cold intolerance.  Hematologic/Lymphatic: Does not bruise/bleed easily.  Skin: Negative for itching and rash.  Musculoskeletal: Negative for myalgias.  Gastrointestinal: Negative for abdominal pain, nausea and vomiting.  Genitourinary: Negative for dysuria.  Neurological: Positive for light-headedness. Negative for dizziness and weakness.  Psychiatric/Behavioral: The patient is not nervous/anxious.   All other systems reviewed and are negative.      *** There were no vitals filed for this visit.  ***  There is no height or weight on file to calculate BMI. There were no vitals filed for this visit.   Objective:   Physical Exam   Constitutional: He is oriented to person, place, and time. He appears well-developed and well-nourished. No distress.  HENT:  Head: Normocephalic and atraumatic.  Eyes: Pupils are equal, round, and reactive to light. Conjunctivae are normal.  Neck: No JVD present.  Cardiovascular: Normal rate, regular rhythm and intact distal pulses.  Murmur heard.  Harsh crescendo-decrescendo midsystolic murmur is present with a grade of 3/6 at the upper right sternal border radiating to the neck. Pulses:      Carotid pulses are on the right side with bruit and on the left side with bruit. Pulmonary/Chest: Effort normal and breath sounds normal. He has no wheezes. He has no rales.  Abdominal: Soft. Bowel sounds are normal. There is no rebound.  Musculoskeletal:        General: No edema.  Lymphadenopathy:    He has no cervical adenopathy.  Neurological: He is alert and oriented to person, place, and time. No cranial nerve deficit.  Skin: Skin is warm and dry.  Psychiatric: He has a normal mood and affect.  Nursing note and vitals reviewed.         Assessment & Recommendations:   85 y/o Caucasian male with hypertension, CKD 3, CAD MI 2002, CABGX3 (LIMA-LAD, SVG-OM, SVG-PDA-2002 in [place]), at least mod AS, mod AI.   AS, AR: Both at least moderate. AS could well be paradoxically low flow low gradient. Other than leg swelling, he is relatively asymptomatic.  A long conversation with patient, his wife, and daughter Loss adjuster, chartered) regarding further management plans.  Patient and wife are planning to go to [place] from December to March for their annual trip to their property.  They are anticipating certain financial deals that they need to be there in person for.  They are worried about his diagnosis and further management options.  At this time, his management is complicated by acute worsening of his renal function.  As such, he is asymptomatic from his moderate to severe aortic stenosis and insufficiency.  I  will perform a modified Bruce protocol exercise treadmill stress test to stratify his aortic stenosis.  If normal stress test, he should have repeat echocardiogram in 6 months.  If abnormal stress test, we may need to expedite work-up for possible TAVR.  Renal function needs to be closely monitored.  I will refer him to nephrology for further management.  At this time, I have held his Lasix for any improvement  in his creatinine.  We will repeat BMP next week.  CAD s/p CABG: Continue Aspirin, statin, metoprolol succinate- he currently takes 50 mg in am and 25 mg in pm and tolerating well.  Hypertension: Fairly well controlled.  Total time spent with patient was 40 minutes and greater than 50% of that time was spent in counseling and coordination care with the patient regarding complex decision making and discussion as state above.   Nigel Mormon, MD [facility] [place] Pager: [phone] Office: [phone] If no answer Cell [phone]

## 2019-01-27 DIAGNOSIS — N183 Chronic kidney disease, stage 3 unspecified: Secondary | ICD-10-CM | POA: Diagnosis not present

## 2019-01-27 DIAGNOSIS — E875 Hyperkalemia: Secondary | ICD-10-CM | POA: Diagnosis not present

## 2019-01-27 DIAGNOSIS — I129 Hypertensive chronic kidney disease with stage 1 through stage 4 chronic kidney disease, or unspecified chronic kidney disease: Secondary | ICD-10-CM | POA: Diagnosis not present

## 2019-01-27 DIAGNOSIS — R399 Unspecified symptoms and signs involving the genitourinary system: Secondary | ICD-10-CM | POA: Diagnosis not present

## 2019-01-29 ENCOUNTER — Other Ambulatory Visit: Payer: Self-pay

## 2019-01-29 ENCOUNTER — Ambulatory Visit (INDEPENDENT_AMBULATORY_CARE_PROVIDER_SITE_OTHER): Payer: Medicare Other | Admitting: Cardiology

## 2019-01-29 ENCOUNTER — Encounter: Payer: Self-pay | Admitting: Cardiology

## 2019-01-29 VITALS — BP 140/64 | HR 82 | Temp 98.1°F | Ht 69.0 in | Wt 145.0 lb

## 2019-01-29 DIAGNOSIS — I352 Nonrheumatic aortic (valve) stenosis with insufficiency: Secondary | ICD-10-CM

## 2019-01-29 DIAGNOSIS — I6523 Occlusion and stenosis of bilateral carotid arteries: Secondary | ICD-10-CM

## 2019-01-29 NOTE — Progress Notes (Signed)
Follow up visit  Subjective:   Trevor Cooper., male    DOB: 1931-07-25, 85 y.o.   MRN: [id number]   Chief Complaint  Patient presents with  . Aortic Stenosis  . Follow-up  . Results    lab GXT    HPI  85 y/o Caucasian male with hypertension, CKD 4, CAD MI 2002, CABGX3 (LIMA-LAD, SVG-OM, SVG-PDA-2002 in [place]), mod AS, mod AI.  In view of progression of aortic stenosis, without any obvious symptoms, patient underwent exercise treadmill stress test.  He had low exercise capacity, he did not have any significant ischemic changes on EKG.  His BNP was borderline at 100.1.  His creatinine has been ranging between 2-2 0.3.  He now has regular nephrology follow-up with Dr. Moshe Cipro.  As discussed previously, patient and his wife are planning to spend the next 3-4 months on their property in [place].  Past Medical History:  Diagnosis Date  . Aortic regurgitation   . Chronic kidney disease   . Coronary artery disease   . GERD (gastroesophageal reflux disease)   . Heart murmur   . Hyperlipidemia   . Hypertension   . Myocardial infarction [facility])      Past Surgical History:  Procedure Laterality Date  . CORONARY ARTERY BYPASS GRAFT  2001    Social History   Socioeconomic History  . Marital status: Married    Spouse name: Not on file  . Number of children: 3  . Years of education: Not on file  . Highest education level: Not on file  Occupational History  . Not on file  Social Needs  . Financial resource strain: Not on file  . Food insecurity    Worry: Not on file    Inability: Not on file  . Transportation needs    Medical: Not on file    Non-medical: Not on file  Tobacco Use  . Smoking status: Never Smoker  . Smokeless tobacco: Never Used  Substance and Sexual Activity  . Alcohol use: Yes    Comment: occasional  . Drug use: Not on file  . Sexual activity: Not on file  Lifestyle  . Physical activity    Days per week: Not on file    Minutes per  session: Not on file  . Stress: Not on file  Relationships  . Social Herbalist on phone: Not on file    Gets together: Not on file    Attends religious service: Not on file    Active member of club or organization: Not on file    Attends meetings of clubs or organizations: Not on file    Relationship status: Not on file  . Intimate partner violence    Fear of current or ex partner: Not on file    Emotionally abused: Not on file    Physically abused: Not on file    Forced sexual activity: Not on file  Other Topics Concern  . Not on file  Social History Narrative  . Not on file     Family History  Problem Relation Age of Onset  . Heart failure Father      Current Outpatient Medications on File Prior to Visit  Medication Sig Dispense Refill  . amLODipine (NORVASC) 5 MG tablet Take 5 mg by mouth daily.    Marland Kitchen aspirin EC 81 MG tablet Take 81 mg by mouth daily.    Marland Kitchen atorvastatin (LIPITOR) 80 MG tablet Take 1 tablet (80 mg total) by mouth  at bedtime. 90 tablet 1  . Cholecalciferol (VITAMIN D3) 25 MCG (1000 UT) CAPS Take by mouth daily.    Marland Kitchen ezetimibe (ZETIA) 10 MG tablet Take 1 tablet (10 mg total) by mouth daily. 90 tablet 3  . metoprolol tartrate (LOPRESSOR) 50 MG tablet Take 1 tablet (50 mg total) by mouth 2 (two) times daily. Take 1 tablet in the morning, and 1/2 tab in evening. (Patient taking differently: Take 50 mg by mouth. 56m in the morning, and 257min evening.) 60 tablet 1  . mineral oil-hydrophilic petrolatum (AQUAPHOR) ointment Apply topically 3 (three) times daily.    . Nutritional Supplements (JUICE PLUS FIBRE PO) Take by mouth 2 (two) times daily. (Garden)     . omeprazole (PRILOSEC) 20 MG capsule Take by mouth daily.    . Marland Kitchenriamcinolone cream (KENALOG) 0.1 % Apply 1 application topically daily.    . vitamin C (ASCORBIC ACID) 250 MG tablet Take 250 mg by mouth daily.    . vitamin E 400 UNIT capsule Take 400 Units by mouth daily.    . bisacodyl (BISACODYL)  5 MG EC tablet Take 5 mg by mouth daily.    . Ferrous Sulfate (IRON HIGH-POTENCY) 142 (45 Fe) MG TBCR Take by mouth daily.     No current facility-administered medications on file prior to visit.     Cardiovascular studies:  Exercise treadmill stress test 01/06/2019: Exercise treadmill stress test performed using modified Bruce protocol.  Patient reached 2.6 METS, and 107% of age predicted maximum heart rate.  Exercise capacity was low.  No chest pain reported. Normal heart rare response. BP response was flat with no significant increase with low level exercise.  Stress EKG revealed no ischemic changes. Relatively unchanged blood pressure response with exercise without presyncope, syncope, or ischemic EKG changes. Intermediate risk study. Recommend clinical correlation   Echocardiogram 12/26/2018: Left ventricle cavity is normal in size. Moderate concentric hypertrophy of the left ventricle. Normal LV systolic function with EF 55%. Normal global wall motion. Doppler evidence of grade I (impaired) diastolic dysfunction, normal LAP. Calculated EF 55%. Left atrial cavity is mildly dilated. Trileaflet aortic valve with mild calcification of the aortic valve annulus, moderate aortic valve leaflet calcification. Moderate to severe aortic valve stenosis. Aortic valve mean gradient of 29 mmHg, Vmax of 3.4  m/s. Calculated aortic valve area by continuity equation is 0.8 cm. DVI of 0.23 suggests possibility of paradoxically low flow low gradient aortic stenosis. Moderate (grade III) aortic regurgitation.  Mild tricuspid regurgitation. Estimated pulmonary artery systolic pressure is 26 mmHg. Compared to previous study on 04/03/2018, there is interval progression of aortic stenosis severity.   Carotid artery duplex  12/26/2018: Stenosis in the right internal carotid artery (16-49%).  Stenosis in the right external carotid artery (<50%). Minimal plaque in the left internal carotid artery (1-15%).  Stenosis  in the left external carotid artery (<50%). Antegrade right vertebral artery flow.  Follow up in one year is appropriate if clinically indicated.  EKG 12/13/2018: Sinus rhythm 65 bpm. Right bundle branch block.    Recent labs: Results for HULUISALBERTO, BEEGLEMRN [id number] of 01/29/2019 14:14  Ref. Range 12/26/2018 10:32 01/13/2019 11:14  Sodium Latest Ref Range: 134 - 144 mmol/L 139 138  Potassium Latest Ref Range: 3.5 - 5.2 mmol/L 5.3 (H) 5.3 (H)  Chloride Latest Ref Range: 96 - 106 mmol/L 101 100  CO2 Latest Ref Range: 20 - 29 mmol/L 22 24  Glucose Latest Ref Range: 65 - 99 mg/dL 101 (  H) 85  BUN Latest Ref Range: 8 - 27 mg/dL 54 (H) 40 (H)  Creatinine Latest Ref Range: 0.76 - 1.27 mg/dL 2.36 (H) 2.02 (H)  Calcium Latest Ref Range: 8.6 - 10.2 mg/dL 9.6 9.7  BUN/Creatinine Ratio Latest Ref Range: 10 - 24  23 20   GFR, Est Non African American Latest Ref Range: >59 mL/min/1.73 24 (L) 29 (L)  GFR, Est African American Latest Ref Range: >59 mL/min/1.73 28 (L) 34 (L)   Lipid panel 04/2017: Chol 110, TG 55, HDL 38, LDL 55    Review of Systems  Constitution: Negative for decreased appetite, malaise/fatigue, weight gain and weight loss.  HENT: Negative for congestion.   Eyes: Negative for visual disturbance.  Cardiovascular: Positive for leg swelling. Negative for chest pain, dyspnea on exertion, palpitations and syncope.  Respiratory: Negative for cough.   Endocrine: Negative for cold intolerance.  Hematologic/Lymphatic: Does not bruise/bleed easily.  Skin: Negative for itching and rash.  Musculoskeletal: Negative for myalgias.  Gastrointestinal: Negative for abdominal pain, nausea and vomiting.  Genitourinary: Negative for dysuria.  Neurological: Positive for light-headedness. Negative for dizziness and weakness.  Psychiatric/Behavioral: The patient is not nervous/anxious.   All other systems reviewed and are negative.       Vitals:   01/29/19 1358  BP: 140/64  Pulse:  82  Temp: 98.1 F (36.7 C)  SpO2: 99%     Body mass index is 21.41 kg/m. Filed Weights   01/29/19 1358  Weight: 145 lb (65.8 kg)     Objective:   Physical Exam  Constitutional: He is oriented to person, place, and time. He appears well-developed and well-nourished. No distress.  HENT:  Head: Normocephalic and atraumatic.  Eyes: Pupils are equal, round, and reactive to light. Conjunctivae are normal.  Neck: No JVD present.  Cardiovascular: Normal rate, regular rhythm and intact distal pulses.  Murmur heard.  Harsh crescendo-decrescendo midsystolic murmur is present with a grade of 3/6 at the upper right sternal border radiating to the neck. Pulses:      Carotid pulses are on the right side with bruit and on the left side with bruit. Pulmonary/Chest: Effort normal and breath sounds normal. He has no wheezes. He has no rales.  Abdominal: Soft. Bowel sounds are normal. There is no rebound.  Musculoskeletal:        General: No edema.  Lymphadenopathy:    He has no cervical adenopathy.  Neurological: He is alert and oriented to person, place, and time. No cranial nerve deficit.  Skin: Skin is warm and dry.  Psychiatric: He has a normal mood and affect.  Nursing note and vitals reviewed.         Assessment & Recommendations:   85 y/o Caucasian male with hypertension, CKD 4, CAD MI 2002, CABGX3 (LIMA-LAD, SVG-OM, SVG-PDA-2002 in [place]), mod AS, mod AI.  Moderate aortic stenosis, moderate aortic regurgitation: Clinically asymptomatic.  Mild patient has had left leg swelling for several years, this is likely due to his venous insufficiency.  No significant EKG changes on modified Bruce protocol exercise treadmill stress test.  I do not think he requires immediate intervention for his valvular dysfunction.  Recommend repeat echocardiogram in April 2020.  CAD s/p CABG: Continue Aspirin, statin, metoprolol succinate- he currently takes 50 mg in am and 25 mg in pm and  tolerating well.  Hypertension: Fairly well controlled.  CKD 4: EGFR 29.  Patient now has regular follow-up with nephrology Dr. Dorene Ar.  I will see patient back after his  return from [place] and repeat echocardiogram in April 2021.   Nigel Mormon, MD [facility] [place] Pager: [phone] Office: [phone] If no answer Cell [phone]

## 2019-01-31 ENCOUNTER — Ambulatory Visit: Payer: Medicare Other | Admitting: Cardiology

## 2019-02-03 DIAGNOSIS — N183 Chronic kidney disease, stage 3 unspecified: Secondary | ICD-10-CM | POA: Diagnosis not present

## 2019-03-06 ENCOUNTER — Other Ambulatory Visit: Payer: Self-pay | Admitting: Cardiology

## 2019-03-06 DIAGNOSIS — R600 Localized edema: Secondary | ICD-10-CM

## 2019-03-27 ENCOUNTER — Other Ambulatory Visit: Payer: Self-pay

## 2019-03-27 MED ORDER — AMLODIPINE BESYLATE 5 MG PO TABS: 5.0000 mg | ORAL_TABLET | Freq: Every day | ORAL | 0 refills | Status: DC

## 2019-03-31 DIAGNOSIS — L853 Xerosis cutis: Secondary | ICD-10-CM | POA: Diagnosis not present

## 2019-03-31 DIAGNOSIS — C4442 Squamous cell carcinoma of skin of scalp and neck: Secondary | ICD-10-CM | POA: Diagnosis not present

## 2019-03-31 DIAGNOSIS — C44622 Squamous cell carcinoma of skin of right upper limb, including shoulder: Secondary | ICD-10-CM | POA: Diagnosis not present

## 2019-03-31 DIAGNOSIS — D692 Other nonthrombocytopenic purpura: Secondary | ICD-10-CM | POA: Diagnosis not present

## 2019-03-31 DIAGNOSIS — L570 Actinic keratosis: Secondary | ICD-10-CM | POA: Diagnosis not present

## 2019-03-31 DIAGNOSIS — L738 Other specified follicular disorders: Secondary | ICD-10-CM | POA: Diagnosis not present

## 2019-04-03 ENCOUNTER — Telehealth: Payer: Self-pay

## 2019-04-03 NOTE — Telephone Encounter (Signed)
Pt dermatologist called and he has a skin surgery coming up and they want to know if its ok he come off ASA 10 days before surgery  [phone]

## 2019-04-03 NOTE — Telephone Encounter (Signed)
10 days is longer than what we generally recommend. Stopping Aspirin 5 days prior should be adequate. Please get details of the dermatology practice so we can send a pre-op letter to them.  Thanks MJP

## 2019-04-04 ENCOUNTER — Telehealth: Payer: Self-pay

## 2019-04-04 NOTE — Telephone Encounter (Signed)
Hi [name], do you know if this was done? Thanks!

## 2019-04-04 NOTE — Telephone Encounter (Signed)
Did you do this for me ?

## 2019-04-04 NOTE — Telephone Encounter (Signed)
I called the patient twice- Am and Pm- he has not returned our calls- vm was left also.   Thanks,  -[place]

## 2019-04-15 DIAGNOSIS — C44622 Squamous cell carcinoma of skin of right upper limb, including shoulder: Secondary | ICD-10-CM | POA: Diagnosis not present

## 2019-04-15 DIAGNOSIS — L980 Pyogenic granuloma: Secondary | ICD-10-CM | POA: Diagnosis not present

## 2019-04-18 DIAGNOSIS — C44622 Squamous cell carcinoma of skin of right upper limb, including shoulder: Secondary | ICD-10-CM | POA: Diagnosis not present

## 2019-04-21 DIAGNOSIS — L03113 Cellulitis of right upper limb: Secondary | ICD-10-CM | POA: Diagnosis not present

## 2019-04-22 ENCOUNTER — Other Ambulatory Visit: Payer: Self-pay | Admitting: Cardiology

## 2019-04-26 ENCOUNTER — Other Ambulatory Visit: Payer: Self-pay | Admitting: Cardiology

## 2019-04-29 DIAGNOSIS — L905 Scar conditions and fibrosis of skin: Secondary | ICD-10-CM | POA: Diagnosis not present

## 2019-04-29 DIAGNOSIS — C4442 Squamous cell carcinoma of skin of scalp and neck: Secondary | ICD-10-CM | POA: Diagnosis not present

## 2019-04-30 NOTE — Telephone Encounter (Signed)
refill 

## 2019-05-13 DIAGNOSIS — C4442 Squamous cell carcinoma of skin of scalp and neck: Secondary | ICD-10-CM | POA: Diagnosis not present

## 2019-06-30 DIAGNOSIS — E7849 Other hyperlipidemia: Secondary | ICD-10-CM | POA: Diagnosis not present

## 2019-06-30 DIAGNOSIS — Z125 Encounter for screening for malignant neoplasm of prostate: Secondary | ICD-10-CM | POA: Diagnosis not present

## 2019-07-08 ENCOUNTER — Ambulatory Visit: Payer: Medicare Other

## 2019-07-08 ENCOUNTER — Other Ambulatory Visit: Payer: Self-pay

## 2019-07-08 DIAGNOSIS — I10 Essential (primary) hypertension: Secondary | ICD-10-CM | POA: Diagnosis not present

## 2019-07-08 DIAGNOSIS — I352 Nonrheumatic aortic (valve) stenosis with insufficiency: Secondary | ICD-10-CM

## 2019-07-08 DIAGNOSIS — R82998 Other abnormal findings in urine: Secondary | ICD-10-CM | POA: Diagnosis not present

## 2019-07-09 DIAGNOSIS — Z1212 Encounter for screening for malignant neoplasm of rectum: Secondary | ICD-10-CM | POA: Diagnosis not present

## 2019-07-11 NOTE — Progress Notes (Deleted)
Follow up visit  Subjective:   Trevor Gill., male    DOB: 1931/11/04, 86 y.o.   MRN: [id number]    *** HPI  No chief complaint on file.   86 y.o. Caucasian male with hypertension, CKD 4, CAD MI 2002, CABGX3 (LIMA-LAD, SVG-OM, SVG-PDA-2002 in [place]), mod AS, mod AI.  ***Her blood pressure was well controlled.   The patient is now here for his 6 month follow up visit and discussion of echocardiogram results.***    ***In view of progression of aortic stenosis, without any obvious symptoms, patient underwent exercise treadmill stress test.  He had low exercise capacity, he did not have any significant ischemic changes on EKG.  His BNP was borderline at 100.1.  His creatinine has been ranging between 2-2 0.3.  He now has regular nephrology follow-up with Dr. Moshe Cipro.  As discussed previously, patient and his wife are planning to spend the next 3-4 months on their property in [place].  *** Current Outpatient Medications on File Prior to Visit  Medication Sig Dispense Refill  . atorvastatin (LIPITOR) 80 MG tablet TAKE 1 TABLET BY MOUTH EVERYDAY AT BEDTIME 90 tablet 1  . amLODipine (NORVASC) 5 MG tablet TAKE 1 TABLET BY MOUTH EVERY DAY 90 tablet 0  . aspirin EC 81 MG tablet Take 81 mg by mouth daily.    . bisacodyl (BISACODYL) 5 MG EC tablet Take 5 mg by mouth daily.    . Cholecalciferol (VITAMIN D3) 25 MCG (1000 UT) CAPS Take by mouth daily.    Marland Kitchen ezetimibe (ZETIA) 10 MG tablet Take 1 tablet (10 mg total) by mouth daily. 90 tablet 3  . Ferrous Sulfate (IRON HIGH-POTENCY) 142 (45 Fe) MG TBCR Take by mouth daily.    . metoprolol tartrate (LOPRESSOR) 50 MG tablet Take 1 tablet (50 mg total) by mouth 2 (two) times daily. Take 1 tablet in the morning, and 1/2 tab in evening. (Patient taking differently: Take 50 mg by mouth. 30m in the morning, and 228min evening.) 60 tablet 1  . mineral oil-hydrophilic petrolatum (AQUAPHOR) ointment Apply topically 3 (three) times daily.    .  Nutritional Supplements (JUICE PLUS FIBRE PO) Take by mouth 2 (two) times daily. (Garden)     . omeprazole (PRILOSEC) 20 MG capsule Take by mouth daily.    . Marland Kitchenriamcinolone cream (KENALOG) 0.1 % Apply 1 application topically daily.    . vitamin C (ASCORBIC ACID) 250 MG tablet Take 250 mg by mouth daily.    . vitamin E 400 UNIT capsule Take 400 Units by mouth daily.     No current facility-administered medications on file prior to visit.    Cardiovascular & other pertient studies:  ***Echocardiogram 07/08/2019:  ***  Exercise treadmill stress test 01/06/2019: Exercise treadmill stress test performed using modified Bruce protocol.  Patient reached 2.6 METS, and 107% of age predicted maximum heart rate.  Exercise capacity was low.  No chest pain reported. Normal heart rare response. BP response was flat with no significant increase with low level exercise.  Stress EKG revealed no ischemic changes. Relatively unchanged blood pressure response with exercise without presyncope, syncope, or ischemic EKG changes. Intermediate risk study. Recommend clinical correlation   Echocardiogram 12/26/2018: Left ventricle cavity is normal in size. Moderate concentric hypertrophy of the left ventricle. Normal LV systolic function with EF 55%. Normal global wall motion. Doppler evidence of grade I (impaired) diastolic dysfunction, normal LAP. Calculated EF 55%. Left atrial cavity is mildly dilated. Trileaflet aortic valve with  mild calcification of the aortic valve annulus, moderate aortic valve leaflet calcification. Moderate to severe aortic valve stenosis. Aortic valve mean gradient of 29 mmHg, Vmax of 3.4  m/s. Calculated aortic valve area by continuity equation is 0.8 cm. DVI of 0.23 suggests possibility of paradoxically low flow low gradient aortic stenosis. Moderate (grade III) aortic regurgitation.  Mild tricuspid regurgitation. Estimated pulmonary artery systolic pressure is 26 mmHg. Compared to previous  study on 04/03/2018, there is interval progression of aortic stenosis severity.   Carotid artery duplex  12/26/2018: Stenosis in the right internal carotid artery (16-49%).  Stenosis in the right external carotid artery (<50%). Minimal plaque in the left internal carotid artery (1-15%).  Stenosis in the left external carotid artery (<50%). Antegrade right vertebral artery flow.  Follow up in one year is appropriate if clinically indicated.   EKG 12/13/2018: Sinus rhythm 65 bpm. Right bundle branch block.   EKG ***/***/202***:  ***Recent labs: 01/13/2019: Glucose 85, BUN/Cr 40/2.02. EGFR 29. Na/K 138/5.3. Rest of the CMP normal  12/26/2018: H/H 3.68/11.3. MCV 92. Platelets 179.  Lipid panel 04/2017: Chol 110, TG 55, HDL 38, LDL 55   *** Review of Systems  Cardiovascular: Negative for chest pain, dyspnea on exertion, leg swelling, palpitations and syncope.        *** There were no vitals filed for this visit.  There is no height or weight on file to calculate BMI. There were no vitals filed for this visit.  *** Objective:   Physical Exam  Constitutional: No distress.  Neck: No JVD present.  Cardiovascular: Normal rate, regular rhythm, normal heart sounds and intact distal pulses.  No murmur heard. Pulmonary/Chest: Effort normal and breath sounds normal. He has no wheezes. He has no rales.  Musculoskeletal:        General: No edema.  Nursing note and vitals reviewed.         Assessment & Recommendations:     ***86 y/o Caucasian male with hypertension, CKD 4, CAD MI 2002, CABGX3 (LIMA-LAD, SVG-OM, SVG-PDA-2002 in [place]), mod AS, mod AI.  Moderate aortic stenosis, moderate aortic regurgitation: Clinically asymptomatic.  Mild patient has had left leg swelling for several years, this is likely due to his venous insufficiency.  No significant EKG changes on modified Bruce protocol exercise treadmill stress test.  I do not think he requires immediate intervention  for his valvular dysfunction.  Recommend repeat echocardiogram in April 2020.  CAD s/p CABG: Continue Aspirin, statin, metoprolol succinate- he currently takes 50 mg in am and 25 mg in pm and tolerating well.  Hypertension: Fairly well controlled.  CKD 4: EGFR 29.  Patient now has regular follow-up with nephrology Dr. Dorene Ar.  I will see patient back after his return from [place] and repeat echocardiogram in April 2021.   Nigel Mormon, MD [facility] Pager: [phone] Office: [phone] If no answer Cell [phone]

## 2019-07-14 ENCOUNTER — Ambulatory Visit: Payer: Medicare Other | Admitting: Cardiology

## 2019-07-16 ENCOUNTER — Ambulatory Visit: Payer: Medicare Other | Admitting: Cardiology

## 2019-07-22 NOTE — Progress Notes (Signed)
Labs 04/25 2021:  Hemosure negative.

## 2019-07-30 NOTE — Progress Notes (Signed)
Follow up visit  Subjective:   Trevor Levi., male    DOB: 10-15-1931, 86 y.o.   MRN: [id number]   HPI   Chief Complaint  Patient presents with  . Aortic Stenosis  . Follow-up    6 month    86 y.o. Caucasian male with hypertension, CKD 4, CAD MI 2002, CABGX3 (LIMA-LAD, SVG-OM, SVG-PDA-2002 in [place]), mod AS, mod AI.  In view of progression of aortic stenosis, without any obvious symptoms, patient underwent exercise treadmill stress test (12/2018).  He had low exercise capacity, he did not have any significant ischemic changes on EKG.  His BNP was borderline at 100.1.  His creatinine has been ranging between 2-2 0.3.  He now has regular nephrology follow-up with Dr. Moshe Cipro.    Patient and his wife spent the winter months in [place], returned to [facility] in March. He has been doing well, and denies chest pain, shortness of breath, palpitations, leg edema, orthopnea, PND, TIA/syncope. Blood pressure is much better controlled on home checks. Renal function has stabilized at Cr around 2.0.   Current Outpatient Medications on File Prior to Visit  Medication Sig Dispense Refill  . amLODipine (NORVASC) 5 MG tablet TAKE 1 TABLET BY MOUTH EVERY DAY 90 tablet 0  . aspirin EC 81 MG tablet Take 81 mg by mouth daily.    Marland Kitchen atorvastatin (LIPITOR) 80 MG tablet TAKE 1 TABLET BY MOUTH EVERYDAY AT BEDTIME 90 tablet 1  . bisacodyl (BISACODYL) 5 MG EC tablet Take 5 mg by mouth daily.    . Cholecalciferol (VITAMIN D3) 25 MCG (1000 UT) CAPS Take by mouth daily.    Marland Kitchen ezetimibe (ZETIA) 10 MG tablet Take 1 tablet (10 mg total) by mouth daily. 90 tablet 3  . metoprolol tartrate (LOPRESSOR) 50 MG tablet Take 1 tablet (50 mg total) by mouth 2 (two) times daily. Take 1 tablet in the morning, and 1/2 tab in evening. (Patient taking differently: Take 50 mg by mouth. 53m in the morning, and 285min evening.) 60 tablet 1  . Nutritional Supplements (JUICE PLUS FIBRE PO) Take by mouth 2 (two) times daily.  (Garden)     . omeprazole (PRILOSEC) 20 MG capsule Take by mouth daily.    . Marland Kitchenriamcinolone cream (KENALOG) 0.1 % Apply 1 application topically daily.    . vitamin C (ASCORBIC ACID) 250 MG tablet Take 250 mg by mouth daily.    . vitamin E 400 UNIT capsule Take 400 Units by mouth daily.    . mineral oil-hydrophilic petrolatum (AQUAPHOR) ointment Apply topically 3 (three) times daily.     No current facility-administered medications on file prior to visit.    Cardiovascular & other pertient studies:  EKG 08/01/2019: Sinus rhythm 61 bpm  Left atrial enlargement Right bundle branch block  Echocardiogram 07/08/2019:  Normal LV systolic function with visual EF 55-60%. Left ventricle cavity is normal in size. Moderate left ventricular hypertrophy. Normal global wall motion. Doppler evidence of grade II diastolic dysfunction, elevated LAP. Calculated EF 54%.  Left atrial cavity is mildly dilated. A lipomatous septum is present.  Right atrial cavity is slightly dilated.  Moderate aortic stenosis. Moderate (Grade II) aortic regurgitation. Peak velocity 3.1717m 25 mean gradient mmHg, 0.27 Dimensional index.  Mild (Grade I) mitral regurgitation.  Mild tricuspid regurgitation.  IVC is dilated with a respiratory response of >50%.  Prior study dated 12/26/2018 noted moderate to severe AS (Vmax 3.1m/60mAVA 0.8 cm, DI 0.23) and LVEF remains stable.  Exercise treadmill stress  test 01/06/2019: Exercise treadmill stress test performed using modified Bruce protocol.  Patient reached 2.6 METS, and 107% of age predicted maximum heart rate.  Exercise capacity was low.  No chest pain reported. Normal heart rare response. BP response was flat with no significant increase with low level exercise.  Stress EKG revealed no ischemic changes. Relatively unchanged blood pressure response with exercise without presyncope, syncope, or ischemic EKG changes. Intermediate risk study. Recommend clinical correlation    Carotid artery duplex  12/26/2018: Stenosis in the right internal carotid artery (16-49%).  Stenosis in the right external carotid artery (<50%). Minimal plaque in the left internal carotid artery (1-15%).  Stenosis in the left external carotid artery (<50%). Antegrade right vertebral artery flow.  Follow up in one year is appropriate if clinically indicated.   Recent labs: 07/08/2019: Glucose 106, BUN/Cr 40/2.0. EGFR 29. Na/K 138/5.3. Rest of the CMP normal H/H 11/37.2.  Chol 112, TG 73, HDL 35, LDL 62  01/13/2019: Glucose 85, BUN/Cr 40/2.02. EGFR 29. Na/K 138/5.3. Rest of the CMP normal  12/26/2018: H/H 3.68/11.3. MCV 92. Platelets 179.  BNP 100.1  Lipid panel 04/2017: Chol 110, TG 55, HDL 38, LDL 55    Review of Systems  Cardiovascular: Negative for chest pain, dyspnea on exertion, leg swelling (Chronic, stable, unilateral left leg swelling), palpitations and syncope.       Vitals:   08/01/19 1018 08/01/19 1028  BP: (!) 182/68 (!) 160/63  Pulse: 62 63  Resp: 17   Temp: (!) 97.3 F (36.3 C)   SpO2: 100% 100%     Body mass index is 23.04 kg/m. Filed Weights   08/01/19 1018  Weight: 156 lb (70.8 kg)     Objective:   Physical Exam  Constitutional: No distress.  Neck: No JVD present.  Cardiovascular: Normal rate, regular rhythm and intact distal pulses.  Murmur heard.  Harsh midsystolic murmur is present with a grade of 2/6 at the upper right sternal border radiating to the neck. High-pitched blowing decrescendo early diastolic murmur is present with a grade of 2/6 at the upper right sternal border radiating to the apex. Pulmonary/Chest: Effort normal and breath sounds normal. He has no wheezes. He has no rales.  Musculoskeletal:        General: No edema.  Nursing note and vitals reviewed.         Assessment & Recommendations:   86 y.o. Caucasian male with hypertension, CKD 4, CAD MI 2002, CABGX3 (LIMA-LAD, SVG-OM, SVG-PDA-2002 in [place]), mod  AS, mod AI.  Moderate aortic stenosis, moderate aortic regurgitation: Clinically asymptomatic.  While patient has had left leg swelling for several years, this is likely due to his venous insufficiency.  No significant EKG changes on modified Bruce protocol exercise treadmill stress test.  I do not think he requires immediate intervention for his valvular dysfunction.  Recommend repeat echocardiogram in 12/2019  CAD s/p CABG: Continue Aspirin, statin, metoprolol succinate- he currently takes 50 mg in am and 25 mg in pm and tolerating well. Increase to 50 mg bid, if SBP stays >150 mmHg.  Hypertension: As above  CKD 4: EGFR 31.  Continue f/u w/nephrology.   Nigel Mormon, MD [facility] [place] Pager: [phone] Office: [phone] If no answer Cell [phone]

## 2019-08-01 ENCOUNTER — Ambulatory Visit: Payer: Medicare Other | Admitting: Cardiology

## 2019-08-01 ENCOUNTER — Encounter: Payer: Self-pay | Admitting: Cardiology

## 2019-08-01 ENCOUNTER — Other Ambulatory Visit: Payer: Self-pay

## 2019-08-01 VITALS — BP 160/63 | HR 63 | Temp 97.3°F | Resp 17 | Ht 69.0 in | Wt 156.0 lb

## 2019-08-01 DIAGNOSIS — N184 Chronic kidney disease, stage 4 (severe): Secondary | ICD-10-CM | POA: Diagnosis not present

## 2019-08-01 DIAGNOSIS — I352 Nonrheumatic aortic (valve) stenosis with insufficiency: Secondary | ICD-10-CM | POA: Diagnosis not present

## 2019-08-01 DIAGNOSIS — I25810 Atherosclerosis of coronary artery bypass graft(s) without angina pectoris: Secondary | ICD-10-CM

## 2019-08-01 DIAGNOSIS — I6523 Occlusion and stenosis of bilateral carotid arteries: Secondary | ICD-10-CM | POA: Diagnosis not present

## 2019-08-25 ENCOUNTER — Other Ambulatory Visit: Payer: Self-pay

## 2019-08-25 MED ORDER — AMLODIPINE BESYLATE 5 MG PO TABS: 5.0000 mg | ORAL_TABLET | Freq: Every day | ORAL | 0 refills | Status: DC

## 2019-09-29 ENCOUNTER — Other Ambulatory Visit: Payer: Self-pay

## 2019-09-29 ENCOUNTER — Encounter: Payer: Self-pay | Admitting: Cardiology

## 2019-09-29 ENCOUNTER — Ambulatory Visit: Payer: Medicare Other | Admitting: Cardiology

## 2019-09-29 VITALS — BP 153/65 | HR 68 | Resp 17 | Ht 69.0 in | Wt 154.0 lb

## 2019-09-29 DIAGNOSIS — I25810 Atherosclerosis of coronary artery bypass graft(s) without angina pectoris: Secondary | ICD-10-CM

## 2019-09-29 DIAGNOSIS — I6523 Occlusion and stenosis of bilateral carotid arteries: Secondary | ICD-10-CM

## 2019-09-29 DIAGNOSIS — N184 Chronic kidney disease, stage 4 (severe): Secondary | ICD-10-CM

## 2019-09-29 DIAGNOSIS — I10 Essential (primary) hypertension: Secondary | ICD-10-CM

## 2019-09-29 DIAGNOSIS — I352 Nonrheumatic aortic (valve) stenosis with insufficiency: Secondary | ICD-10-CM

## 2019-09-29 NOTE — Progress Notes (Signed)
Follow up visit  Subjective:   Trevor Barretta., male    DOB: 1931-11-26, 86 y.o.   MRN: [id number]   HPI   Chief Complaint  Patient presents with  . Aortic Stenosis  . Follow-up    2 month    86 y.o. Caucasian male with hypertension, CKD 4, CAD MI 2002, CABGX3 (LIMA-LAD, SVG-OM, SVG-PDA-2002 in [place]), mod AS, mod AI.  In view of progression of aortic stenosis, without any obvious symptoms, patient underwent exercise treadmill stress test (12/2018).  He had low exercise capacity, he did not have any significant ischemic changes on EKG.  His BNP was borderline at 100.1.  His creatinine has been ranging between 2-2 0.3.  He now has regular nephrology follow-up with Dr. Moshe Cipro.    Patient and his wife spent the winter months in [place], returned to [facility] in March 2021. He has been doing well, and denies chest pain, shortness of breath, palpitations, leg edema, orthopnea, PND, TIA/syncope. Blood pressure is much better controlled on home checks.   Recently, his renal function has deteriorated.  Blood pressure elevated today, although home readings are much lower.  There are some fluctuations in his home blood pressure readings as well.    Current Outpatient Medications on File Prior to Visit  Medication Sig Dispense Refill  . amLODipine (NORVASC) 5 MG tablet Take 1 tablet (5 mg total) by mouth daily. 90 tablet 0  . aspirin EC 81 MG tablet Take 81 mg by mouth daily.    Marland Kitchen atorvastatin (LIPITOR) 80 MG tablet TAKE 1 TABLET BY MOUTH EVERYDAY AT BEDTIME 90 tablet 1  . bisacodyl (BISACODYL) 5 MG EC tablet Take 5 mg by mouth daily.    . Cholecalciferol (VITAMIN D3) 25 MCG (1000 UT) CAPS Take by mouth daily.    Marland Kitchen ezetimibe (ZETIA) 10 MG tablet Take 1 tablet (10 mg total) by mouth daily. 90 tablet 3  . metoprolol tartrate (LOPRESSOR) 50 MG tablet Take 1 tablet (50 mg total) by mouth 2 (two) times daily. Take 1 tablet in the morning, and 1/2 tab in evening. (Patient taking differently:  Take 50 mg by mouth. ) 60 tablet 1  . mineral oil-hydrophilic petrolatum (AQUAPHOR) ointment Apply topically 3 (three) times daily.    . Nutritional Supplements (JUICE PLUS FIBRE PO) Take by mouth 2 (two) times daily. (Garden)     . omeprazole (PRILOSEC) 20 MG capsule Take by mouth daily.    Marland Kitchen triamcinolone cream (KENALOG) 0.1 % Apply 1 application topically daily.    . vitamin C (ASCORBIC ACID) 250 MG tablet Take 250 mg by mouth daily.    . vitamin E 400 UNIT capsule Take 400 Units by mouth daily.     No current facility-administered medications on file prior to visit.    Cardiovascular & other pertient studies:  EKG 08/01/2019: Sinus rhythm 61 bpm  Left atrial enlargement Right bundle branch block  Echocardiogram 07/08/2019:  Normal LV systolic function with visual EF 55-60%. Left ventricle cavity is normal in size. Moderate left ventricular hypertrophy. Normal global wall motion. Doppler evidence of grade II diastolic dysfunction, elevated LAP. Calculated EF 54%.  Left atrial cavity is mildly dilated. A lipomatous septum is present.  Right atrial cavity is slightly dilated.  Moderate aortic stenosis. Moderate (Grade II) aortic regurgitation. Peak velocity 3.46ms, 25 mean gradient mmHg, 0.27 Dimensional index.  Mild (Grade I) mitral regurgitation.  Mild tricuspid regurgitation.  IVC is dilated with a respiratory response of >50%.  Prior study dated  12/26/2018 noted moderate to severe AS (Vmax 3.85ms, AVA 0.8 cm, DI 0.23) and LVEF remains stable.  Exercise treadmill stress test 01/06/2019: Exercise treadmill stress test performed using modified Bruce protocol.  Patient reached 2.6 METS, and 107% of age predicted maximum heart rate.  Exercise capacity was low.  No chest pain reported. Normal heart rare response. BP response was flat with no significant increase with low level exercise.  Stress EKG revealed no ischemic changes. Relatively unchanged blood pressure response with  exercise without presyncope, syncope, or ischemic EKG changes. Intermediate risk study. Recommend clinical correlation   Carotid artery duplex  12/26/2018: Stenosis in the right internal carotid artery (16-49%).  Stenosis in the right external carotid artery (<50%). Minimal plaque in the left internal carotid artery (1-15%).  Stenosis in the left external carotid artery (<50%). Antegrade right vertebral artery flow.  Follow up in one year is appropriate if clinically indicated.   Recent labs: 09/17/2019: Glucose 99, BUN/Cr 60/2.6. EGFR 21. Na/K 138/4.3.   07/08/2019: Glucose 106, BUN/Cr 40/2.0. EGFR 29. Na/K 138/5.3. Rest of the CMP normal H/H 11/37.2.  Chol 112, TG 73, HDL 35, LDL 62  01/13/2019: Glucose 85, BUN/Cr 40/2.02. EGFR 29. Na/K 138/5.3. Rest of the CMP normal  12/26/2018: H/H 3.68/11.3. MCV 92. Platelets 179.  BNP 100.1  Lipid panel 04/2017: Chol 110, TG 55, HDL 38, LDL 55    Review of Systems  Cardiovascular: Negative for chest pain, dyspnea on exertion, leg swelling (Chronic, stable, unilateral left leg swelling), palpitations and syncope.       Vitals:   09/29/19 1018  BP: (!) 153/65  Pulse: 68  Resp: 17  SpO2: 98%     Body mass index is 22.74 kg/m. Filed Weights   09/29/19 1018  Weight: 154 lb (69.9 kg)     Objective:   Physical Exam Vitals and nursing note reviewed.  Constitutional:      General: He is not in acute distress. Neck:     Vascular: No JVD.  Cardiovascular:     Rate and Rhythm: Normal rate and regular rhythm.     Pulses: Intact distal pulses.     Heart sounds: Murmur heard.  Harsh midsystolic murmur is present with a grade of 2/6 at the upper right sternal border radiating to the neck. High-pitched blowing decrescendo early diastolic murmur is present with a grade of 2/4 at the upper right sternal border radiating to the apex.   Pulmonary:     Effort: Pulmonary effort is normal.     Breath sounds: Normal breath sounds. No  wheezing or rales.           Assessment & Recommendations:   86y.o. Caucasian male with hypertension, CKD 4, CAD MI 2002, CABGX3 (LIMA-LAD, SVG-OM, SVG-PDA-2002 in [place], mod AS, mod AI.  Moderate aortic stenosis, moderate aortic regurgitation: Clinically asymptomatic.  While patient has had left leg swelling for several years, this is likely due to his venous insufficiency.  No significant EKG changes on modified Bruce protocol exercise treadmill stress test.  I do not think he requires immediate intervention for his valvular dysfunction.  Recommend repeat echocardiogram in 12/2019  CAD s/p CABG: Continue Aspirin, statin, blood pressure control   Hypertension:  Blood pressure elevated today.  There are fluctuations in home readings.  He currently takes metoprolol succinate 50 mg twice daily.  While this is unusual dosing, is tolerated this well for many years.   Given his aortic regurgitation, I would not recommend further increasing metoprolol  dose at this time.   Recommend remote patient monitoring.  If blood pressure remains elevated, could consider adding hydralazine.    CKD 4: Continue f/u w/nephrology.  F/u after echocardiogram in 12/2019.  Nigel Mormon, MD [facility] Pager: [phone] Office: [phone] If no answer Cell [phone]

## 2019-10-18 ENCOUNTER — Other Ambulatory Visit: Payer: Self-pay | Admitting: Cardiology

## 2019-10-18 DIAGNOSIS — I10 Essential (primary) hypertension: Secondary | ICD-10-CM | POA: Diagnosis not present

## 2019-10-27 DIAGNOSIS — D0422 Carcinoma in situ of skin of left ear and external auricular canal: Secondary | ICD-10-CM | POA: Diagnosis not present

## 2019-10-27 DIAGNOSIS — D0461 Carcinoma in situ of skin of right upper limb, including shoulder: Secondary | ICD-10-CM | POA: Diagnosis not present

## 2019-10-27 DIAGNOSIS — L821 Other seborrheic keratosis: Secondary | ICD-10-CM | POA: Diagnosis not present

## 2019-10-27 DIAGNOSIS — L570 Actinic keratosis: Secondary | ICD-10-CM | POA: Diagnosis not present

## 2019-10-27 DIAGNOSIS — D1801 Hemangioma of skin and subcutaneous tissue: Secondary | ICD-10-CM | POA: Diagnosis not present

## 2019-10-27 DIAGNOSIS — Z85828 Personal history of other malignant neoplasm of skin: Secondary | ICD-10-CM | POA: Diagnosis not present

## 2019-10-27 DIAGNOSIS — L578 Other skin changes due to chronic exposure to nonionizing radiation: Secondary | ICD-10-CM | POA: Diagnosis not present

## 2019-10-29 DIAGNOSIS — N189 Chronic kidney disease, unspecified: Secondary | ICD-10-CM | POA: Diagnosis not present

## 2019-10-29 DIAGNOSIS — N183 Chronic kidney disease, stage 3 unspecified: Secondary | ICD-10-CM | POA: Diagnosis not present

## 2019-11-05 DIAGNOSIS — C61 Malignant neoplasm of prostate: Secondary | ICD-10-CM | POA: Diagnosis not present

## 2019-11-17 ENCOUNTER — Other Ambulatory Visit: Payer: Self-pay | Admitting: Cardiology

## 2019-11-18 ENCOUNTER — Other Ambulatory Visit (HOSPITAL_COMMUNITY): Payer: Self-pay | Admitting: Urology

## 2019-11-18 DIAGNOSIS — I10 Essential (primary) hypertension: Secondary | ICD-10-CM | POA: Diagnosis not present

## 2019-11-18 DIAGNOSIS — C61 Malignant neoplasm of prostate: Secondary | ICD-10-CM

## 2019-12-02 ENCOUNTER — Ambulatory Visit (HOSPITAL_COMMUNITY)
Admission: RE | Admit: 2019-12-02 | Discharge: 2019-12-02 | Disposition: A | Discharge status: Home / Self Care | Payer: Medicare Other | Source: Ambulatory Visit | Attending: Urology | Admitting: Urology

## 2019-12-02 ENCOUNTER — Other Ambulatory Visit: Payer: Self-pay

## 2019-12-02 DIAGNOSIS — N402 Nodular prostate without lower urinary tract symptoms: Secondary | ICD-10-CM | POA: Diagnosis not present

## 2019-12-02 DIAGNOSIS — C61 Malignant neoplasm of prostate: Secondary | ICD-10-CM

## 2019-12-02 DIAGNOSIS — N1330 Unspecified hydronephrosis: Secondary | ICD-10-CM | POA: Diagnosis not present

## 2019-12-02 MED ORDER — AXUMIN (FLUCICLOVINE F 18) INJECTION
9.5000 | Freq: Once | INTRAVENOUS | Status: AC
  Administered 2019-12-02: 9.5 via INTRAVENOUS

## 2019-12-08 DIAGNOSIS — C61 Malignant neoplasm of prostate: Secondary | ICD-10-CM | POA: Diagnosis not present

## 2019-12-08 DIAGNOSIS — N130 Hydronephrosis with ureteropelvic junction obstruction: Secondary | ICD-10-CM | POA: Diagnosis not present

## 2019-12-10 ENCOUNTER — Other Ambulatory Visit: Payer: Self-pay | Admitting: Urology

## 2019-12-10 ENCOUNTER — Other Ambulatory Visit (HOSPITAL_COMMUNITY): Payer: Self-pay | Admitting: Urology

## 2019-12-10 DIAGNOSIS — N130 Hydronephrosis with ureteropelvic junction obstruction: Secondary | ICD-10-CM

## 2019-12-11 NOTE — Telephone Encounter (Signed)
Documented in chart.

## 2019-12-17 ENCOUNTER — Encounter (HOSPITAL_COMMUNITY): Payer: Self-pay

## 2019-12-17 ENCOUNTER — Other Ambulatory Visit: Payer: Self-pay

## 2019-12-17 ENCOUNTER — Emergency Department (HOSPITAL_COMMUNITY)
Admission: EM | Admit: 2019-12-17 | Discharge: 2019-12-17 | Disposition: A | Discharge status: Home / Self Care | Payer: Medicare Other | Attending: Emergency Medicine | Admitting: Emergency Medicine

## 2019-12-17 ENCOUNTER — Emergency Department (HOSPITAL_COMMUNITY)
Admission: RE | Admit: 2019-12-17 | Discharge: 2019-12-17 | Disposition: A | Discharge status: Home / Self Care | Payer: Medicare Other | Source: Ambulatory Visit | Attending: Urology | Admitting: Urology

## 2019-12-17 DIAGNOSIS — N130 Hydronephrosis with ureteropelvic junction obstruction: Secondary | ICD-10-CM

## 2019-12-17 DIAGNOSIS — Z7982 Long term (current) use of aspirin: Secondary | ICD-10-CM | POA: Diagnosis not present

## 2019-12-17 DIAGNOSIS — N2889 Other specified disorders of kidney and ureter: Secondary | ICD-10-CM | POA: Diagnosis not present

## 2019-12-17 DIAGNOSIS — N1330 Unspecified hydronephrosis: Secondary | ICD-10-CM | POA: Diagnosis not present

## 2019-12-17 DIAGNOSIS — I25810 Atherosclerosis of coronary artery bypass graft(s) without angina pectoris: Secondary | ICD-10-CM | POA: Diagnosis not present

## 2019-12-17 DIAGNOSIS — Z8546 Personal history of malignant neoplasm of prostate: Secondary | ICD-10-CM | POA: Insufficient documentation

## 2019-12-17 DIAGNOSIS — R338 Other retention of urine: Secondary | ICD-10-CM | POA: Diagnosis not present

## 2019-12-17 DIAGNOSIS — I1310 Hypertensive heart and chronic kidney disease without heart failure, with stage 1 through stage 4 chronic kidney disease, or unspecified chronic kidney disease: Secondary | ICD-10-CM | POA: Diagnosis not present

## 2019-12-17 DIAGNOSIS — C61 Malignant neoplasm of prostate: Secondary | ICD-10-CM | POA: Diagnosis not present

## 2019-12-17 DIAGNOSIS — R339 Retention of urine, unspecified: Secondary | ICD-10-CM

## 2019-12-17 DIAGNOSIS — Z79899 Other long term (current) drug therapy: Secondary | ICD-10-CM | POA: Insufficient documentation

## 2019-12-17 DIAGNOSIS — N184 Chronic kidney disease, stage 4 (severe): Secondary | ICD-10-CM | POA: Insufficient documentation

## 2019-12-17 DIAGNOSIS — N35811 Other urethral stricture, male, meatal: Secondary | ICD-10-CM | POA: Diagnosis not present

## 2019-12-17 MED ORDER — FUROSEMIDE 10 MG/ML IJ SOLN
35.0000 mg | Freq: Once | INTRAMUSCULAR | Status: AC
  Administered 2019-12-17: 35 mg via INTRAVENOUS

## 2019-12-17 MED ORDER — TECHNETIUM TC 99M MERTIATIDE
4.5000 | Freq: Once | INTRAVENOUS | Status: AC | PRN
  Administered 2019-12-17: 4.5 via INTRAVENOUS

## 2019-12-17 MED ORDER — FUROSEMIDE 10 MG/ML IJ SOLN
INTRAMUSCULAR | Status: AC
  Filled 2019-12-17: qty 4

## 2019-12-17 MED ORDER — GENTAMICIN SULFATE 40 MG/ML IJ SOLN
160.0000 mg | Freq: Once | INTRAMUSCULAR | Status: AC
  Administered 2019-12-17: 160 mg via INTRAMUSCULAR
  Filled 2019-12-17: qty 4

## 2019-12-17 NOTE — Consult Note (Signed)
Urology Consult  Consulting MD: Hillard Danker  CC: Urinary retention  HPI: This is a 86year old male with history of prostate cancer, status post brachytherapy several years ago.  He came in the office today for catheter placement prior to renogram.  Unfortunately, catheter placement was difficult, and the patient was discharged from the office.  Apparently, he was able to pass urine at that time.  He called our office/answering service tonight and has been unable to void for the past 9 hours and has been uncomfortable.  He is only able to pass small drops of urine.  He presents to the emergency room for further management.  PMH: Past Medical History:  Diagnosis Date  . Aortic regurgitation   . Chronic kidney disease   . Coronary artery disease   . GERD (gastroesophageal reflux disease)   . Heart murmur   . Hyperlipidemia   . Hypertension   . Myocardial infarction [facility])     PSH: Past Surgical History:  Procedure Laterality Date  . CORONARY ARTERY BYPASS GRAFT  2001    Allergies: Allergies  Allergen Reactions  . Ciprofloxacin   . Clindamycin/Lincomycin   . Hydrocodone   . Lisinopril   . Penicillins     Medications: (Not in a hospital admission)    Social History: Social History   Socioeconomic History  . Marital status: Married    Spouse name: Not on file  . Number of children: 3  . Years of education: Not on file  . Highest education level: Not on file  Occupational History  . Not on file  Tobacco Use  . Smoking status: Never Smoker  . Smokeless tobacco: Never Used  Vaping Use  . Vaping Use: Never used  Substance and Sexual Activity  . Alcohol use: Yes    Comment: occasional  . Drug use: Never  . Sexual activity: Not on file  Other Topics Concern  . Not on file  Social History Narrative  . Not on file   Social Determinants of Health   Financial Resource Strain:   . Difficulty of Paying Living Expenses: Not on file  Food Insecurity:   . Worried  About Charity fundraiser in the Last Year: Not on file  . Ran Out of Food in the Last Year: Not on file  Transportation Needs:   . Lack of Transportation (Medical): Not on file  . Lack of Transportation (Non-Medical): Not on file  Physical Activity:   . Days of Exercise per Week: Not on file  . Minutes of Exercise per Session: Not on file  Stress:   . Feeling of Stress : Not on file  Social Connections:   . Frequency of Communication with Friends and Family: Not on file  . Frequency of Social Gatherings with Friends and Family: Not on file  . Attends Religious Services: Not on file  . Active Member of Clubs or Organizations: Not on file  . Attends Archivist Meetings: Not on file  . Marital Status: Not on file  Intimate Partner Violence:   . Fear of Current or Ex-Partner: Not on file  . Emotionally Abused: Not on file  . Physically Abused: Not on file  . Sexually Abused: Not on file    Family History: Family History  Problem Relation Age of Onset  . Heart failure Father     Review of Systems: Positive: Bladder pain, frequency, urgency, inability urinate Negative: .  A further 10 point review of systems was negative except  what is listed in the HPI.  Physical Exam: @VITALS2 @ General: No acute distress.  Awake. Head:  Normocephalic.  Atraumatic. ENT:  EOMI.  Mucous membranes moist Neck:  Supple.  No lymphadenopathy. CV:  Regular rate. Pulmonary: Equal effort bilaterally.   Abdomen: Soft.  None tender to palpation.  Bladder is nonpalpable Skin:  Normal turgor.  No visible rash. Extremity: No gross deformity of extremities.  Neurologic: Alert. Appropriate mood. Penis:  Circumcised.  No lesions.  There is mild distal penile edema Urethra: Orthotopic meatus. Scrotum: No lesions.  No ecchymosis.  No erythema. Testicles: Descended bilaterally.  No masses bilaterally. Epididymis: Palpable bilaterally.  Non Tender to palpation.  Studies:  No results for  input(s): HGB, WBC, PLT in the last 72 hours.  No results for input(s): NA, K, CL, CO2, BUN, CREATININE, CALCIUM, GFRNONAA, GFRAA in the last 72 hours.  Invalid input(s): MAGNESIUM   No results for input(s): INR, APTT in the last 72 hours.  Invalid input(s): PT   Invalid input(s): ABG  Procedure note,  After sterile prep and drape, 2% viscous lidocaine introduced into the urethra.  I was able to navigate a sensor tip guidewire through the urethra and into what I thought was the bladder.  I then dilated over top of the sensor tip guidewire up to 22 [place] with Hayman sounds.  This was quite uneventful.  Following this, the sound was removed and I passed an 9 [place] council tip catheter over top of the guidewire.  Slight resistance at the neck of the bladder but I was then able to pass it into the bladder.  Urine was expressed, the guidewire removed, the balloon filled with 10 cc of water and hooked to leg bag drainage.  The patient tolerated the procedure well.  Following this, he was given 160 mg of gentamicin IM for prophylaxis.  Assessment: Urethral stricture with urinary retention, dilated  Plan: 1.  We will administer 160 mg of gentamicin  2.  He has follow-up already on 6 October with our office.    Pager:[phone]

## 2019-12-17 NOTE — ED Provider Notes (Addendum)
[facility] DEPT Provider Note   CSN: [id number] Arrival date & time: 12/17/19  2015     History Chief Complaint  Patient presents with  . Urinary Retention    Findley Vi. is a 86 y.o. male.  HPI      86 year old male with history below including history prostate cancer who presents with concern for urinary retention in the setting of having difficult catheter placement prior to renogram in the urology office.  Patient had renogram today, went to the office for catheter placement, however catheter placement was difficult and he was discharged from the office.  He was able to pass urine at that time.  Tonight, he called the urology answering service as he was unable to void.  Reports he has not been able to urinate since 12 PM.  Reports significant suprapubic discomfort.  He is able to urinate just a couple of drops at a time.  Past Medical History:  Diagnosis Date  . Aortic regurgitation   . Chronic kidney disease   . Coronary artery disease   . GERD (gastroesophageal reflux disease)   . Heart murmur   . Hyperlipidemia   . Hypertension   . Myocardial infarction [facility])     Patient Active Problem List   Diagnosis Date Noted  . CKD (chronic kidney disease), stage IV ([place]) 08/01/2019  . Asymptomatic bilateral carotid artery stenosis 08/01/2019  . Atherosclerosis of coronary artery bypass graft of native heart without angina pectoris 12/13/2018  . Essential hypertension 12/13/2018  . Bilateral carotid bruits 12/13/2018  . Leg edema 12/13/2018  . Myocardial infarction ([place])   . Nonrheumatic aortic insufficiency with aortic stenosis   . GERD (gastroesophageal reflux disease)   . CKD (chronic kidney disease) stage 3, GFR 30-59 ml/min ([place]) 08/03/2018    Past Surgical History:  Procedure Laterality Date  . CORONARY ARTERY BYPASS GRAFT  2001       Family History  Problem Relation Age of Onset  . Heart failure Father     Social  History   Tobacco Use  . Smoking status: Never Smoker  . Smokeless tobacco: Never Used  Vaping Use  . Vaping Use: Never used  Substance Use Topics  . Alcohol use: Yes    Comment: occasional  . Drug use: Never    Home Medications Prior to Admission medications   Medication Sig Start Date End Date Taking? Authorizing Provider  amLODipine (NORVASC) 5 MG tablet TAKE 1 TABLET BY MOUTH EVERY DAY Patient taking differently: Take 5 mg by mouth every evening.  11/17/19  Yes Patwardhan, Reynold Bowen, MD  aspirin EC 81 MG tablet Take 81 mg by mouth daily.   Yes [provider]  atorvastatin (LIPITOR) 80 MG tablet TAKE 1 TABLET BY MOUTH EVERYDAY AT BEDTIME Patient taking differently: Take 80 mg by mouth at bedtime.  10/20/19  Yes Patwardhan, Manish J, MD  bisacodyl (BISACODYL) 5 MG EC tablet Take 5 mg by mouth daily.   Yes [provider]  Cholecalciferol (VITAMIN D3) 25 MCG (1000 UT) CAPS Take 1 capsule by mouth every evening.    Yes [provider]  ezetimibe (ZETIA) 10 MG tablet Take 1 tablet (10 mg total) by mouth daily. 12/04/18  Yes Adrian Prows, MD  Ferrous Sulfate (IRON PO) Take 1 tablet by mouth in the morning and at bedtime.   Yes [provider]  metoprolol succinate (TOPROL-XL) 50 MG 24 hr tablet Take 50 mg by mouth in the morning and at  bedtime. Take with or immediately following a meal.   Yes [provider]  mineral oil-hydrophilic petrolatum (AQUAPHOR) ointment Apply 1 application topically 2 (two) times daily as needed for dry skin.    Yes [provider]  Nutritional Supplements (JUICE PLUS FIBRE PO) Take 2 capsules by mouth 2 (two) times daily. ([facility])    Yes [provider]  omeprazole (PRILOSEC) 20 MG capsule Take 20 mg by mouth daily.  07/11/18  Yes [provider]  triamcinolone cream (KENALOG) 0.1 % Apply 1 application topically daily.   Yes [provider]  vitamin C (ASCORBIC ACID) 250 MG tablet Take  125 mg by mouth daily.    Yes [provider]  vitamin E 400 UNIT capsule Take 400 Units by mouth every evening.    Yes [provider]    Allergies    Ciprofloxacin, Clindamycin/lincomycin, Hydrocodone, Lisinopril, and Penicillins  Review of Systems   Review of Systems  Constitutional: Negative for fever.  Respiratory: Negative for shortness of breath.   Cardiovascular: Negative for chest pain.  Gastrointestinal: Positive for abdominal pain. Negative for nausea and vomiting.  Genitourinary: Positive for difficulty urinating.  Musculoskeletal: Negative for back pain.    Physical Exam Updated Vital Signs BP (!) 192/87 (BP Location: Right Arm)   Pulse 87   Temp 98 F (36.7 C) (Oral)   Resp 16   Ht 5\' 10"  (1.778 m)   Wt 68 kg   SpO2 98%   BMI 21.52 kg/m   Physical Exam Vitals and nursing note reviewed.  Constitutional:      General: He is not in acute distress.    Appearance: Normal appearance. He is not ill-appearing, toxic-appearing or diaphoretic.  HENT:     Head: Normocephalic.  Eyes:     Conjunctiva/sclera: Conjunctivae normal.  Cardiovascular:     Rate and Rhythm: Normal rate and regular rhythm.     Pulses: Normal pulses.  Pulmonary:     Effort: Pulmonary effort is normal. No respiratory distress.  Abdominal:     Tenderness: There is abdominal tenderness (suprapubic).  Musculoskeletal:        General: No deformity or signs of injury.     Cervical back: No rigidity.  Skin:    General: Skin is warm and dry.     Coloration: Skin is not jaundiced or pale.  Neurological:     General: No focal deficit present.     Mental Status: He is alert and oriented to person, place, and time.     ED Results / Procedures / Treatments   Labs (all labs ordered are listed, but only abnormal results are displayed) Labs Reviewed - No data to display  EKG None  Radiology NM Renal Imaging Flow W/Pharm  Result Date: 12/17/2019 CLINICAL DATA:   Hydronephrosis, question renal function history prostate cancer EXAM: NUCLEAR MEDICINE RENAL SCAN WITH DIURETIC ADMINISTRATION TECHNIQUE: Radionuclide angiographic and sequential renal images were obtained after intravenous injection of radiopharmaceutical. Imaging was continued during slow intravenous injection of Lasix approximately 15 minutes after the start of the examination. RADIOPHARMACEUTICALS:  4.5 mCi Technetium-12m MAG3 IV Pharmaceutical: Lasix 35 mg IV at 20 minutes COMPARISON:  None. FINDINGS: Flow: Prompt flow to RIGHT kidney. Markedly delayed and diminished flow to LEFT kidney. Left renogram: Minimal uptake of tracer. No appreciable concentration or excretion of tracer. Flat renogram curve. No change following Lasix administration. Right renogram: Normal uptake, concentration and excretion of tracer by RIGHT kidney. Accumulation of tracer within dilated RIGHT renal  collecting system. Suboptimal clearance prior to Lasix. Following Lasix administration good washout of tracer is seen from the RIGHT kidney. Mild retention of tracer within the RIGHT renal collecting system at the conclusion of the exam. Analysis of the renogram curve demonstrates a slightly delayed time to peak activity of 9.2 minutes with fall to half maximum activity 17.8 minutes later. Differential: Left kidney = 18 % ; Right kidney = 82 % T1/2 post Lasix : Left kidney = N/A min Right kidney = 17.8 min IMPRESSION: Minimal/markedly impaired LEFT renal function. Normal RIGHT renal function with mild RIGHT hydronephrosis, though there is good clearance of tracer following diuretic administration. No evidence of urinary outflow obstruction from the RIGHT kidney. Electronically Signed   By: Lavonia Dana M.D.   On: 12/17/2019 14:48    Procedures Procedures (including critical care time)  Medications Ordered in ED Medications  gentamicin (GARAMYCIN) injection 160 mg (160 mg Intramuscular Given 12/17/19 2219)    ED Course  I have  reviewed the triage vital signs and the nursing notes.  Pertinent labs & imaging results that were available during my care of the patient were reviewed by me and considered in my medical decision making (see chart for details).    MDM Rules/Calculators/A&P                          86 year old male with history below including history prostate cancer who presents with concern for urinary retention in the setting of having difficult catheter placement prior to renogram in the urology office.  BP elevated in setting of discomfort-not having symptoms consistent with blood pressure emergency.  Given development of urinary retention symptoms today, do not feel lab work will alter plan of care.    [facility], Dr. Diona Fanti came to the bedside who was able to place an 19 French catheter over a guidewire. Given IM gent for prophylaxis and discharged with Urology follow up.      Final Clinical Impression(s) / ED Diagnoses Final diagnoses:  Urinary retention    Rx / DC Orders ED Discharge Orders    None       Gareth Morgan, MD 12/19/19 9937    Gareth Morgan, MD 12/19/19 [phone]

## 2019-12-17 NOTE — ED Triage Notes (Signed)
Pt reports being seen at urology today for urinary retention and a renogram. Nurses and MD were unable to place a catheter. After study pt urinated so they held off. After going home pt unable to urinate again.

## 2019-12-18 DIAGNOSIS — I10 Essential (primary) hypertension: Secondary | ICD-10-CM | POA: Diagnosis not present

## 2019-12-19 DIAGNOSIS — Z23 Encounter for immunization: Secondary | ICD-10-CM | POA: Diagnosis not present

## 2019-12-24 DIAGNOSIS — R338 Other retention of urine: Secondary | ICD-10-CM | POA: Diagnosis not present

## 2019-12-24 DIAGNOSIS — R9721 Rising PSA following treatment for malignant neoplasm of prostate: Secondary | ICD-10-CM | POA: Diagnosis not present

## 2019-12-24 DIAGNOSIS — C61 Malignant neoplasm of prostate: Secondary | ICD-10-CM | POA: Diagnosis not present

## 2019-12-24 DIAGNOSIS — N130 Hydronephrosis with ureteropelvic junction obstruction: Secondary | ICD-10-CM | POA: Diagnosis not present

## 2019-12-29 ENCOUNTER — Other Ambulatory Visit: Payer: Self-pay

## 2019-12-29 ENCOUNTER — Other Ambulatory Visit: Payer: Self-pay | Admitting: Cardiology

## 2019-12-29 ENCOUNTER — Ambulatory Visit: Payer: Medicare Other

## 2019-12-29 ENCOUNTER — Telehealth: Payer: Self-pay | Admitting: Pharmacist

## 2019-12-29 DIAGNOSIS — I352 Nonrheumatic aortic (valve) stenosis with insufficiency: Secondary | ICD-10-CM

## 2019-12-29 DIAGNOSIS — I6523 Occlusion and stenosis of bilateral carotid arteries: Secondary | ICD-10-CM

## 2019-12-29 NOTE — Telephone Encounter (Signed)
Med list reviewed and updated 

## 2019-12-31 NOTE — Progress Notes (Signed)
Yours, I get this because I ordered for future

## 2020-01-05 ENCOUNTER — Encounter: Payer: Self-pay | Admitting: Cardiology

## 2020-01-05 ENCOUNTER — Other Ambulatory Visit: Payer: Self-pay

## 2020-01-05 ENCOUNTER — Ambulatory Visit: Payer: Medicare Other | Admitting: Cardiology

## 2020-01-05 VITALS — BP 161/65 | HR 69 | Resp 17 | Ht 70.0 in | Wt 145.0 lb

## 2020-01-05 DIAGNOSIS — I352 Nonrheumatic aortic (valve) stenosis with insufficiency: Secondary | ICD-10-CM

## 2020-01-05 DIAGNOSIS — N184 Chronic kidney disease, stage 4 (severe): Secondary | ICD-10-CM | POA: Diagnosis not present

## 2020-01-05 DIAGNOSIS — I6523 Occlusion and stenosis of bilateral carotid arteries: Secondary | ICD-10-CM

## 2020-01-05 DIAGNOSIS — I10 Essential (primary) hypertension: Secondary | ICD-10-CM

## 2020-01-05 NOTE — Progress Notes (Signed)
Follow up visit  Subjective:   Trevor Cooper., male    DOB: 1931/05/07, 86 y.o.   MRN: [id number]   HPI   Chief Complaint  Patient presents with   Nonrheumatic aortic insufficiency with aortic stenosis   Follow-up   Results    duplex and echo    86 y.o. Caucasian male with hypertension, CKD 4, CAD MI 2002, CABGX3 (LIMA-LAD, SVG-OM, SVG-PDA-2002 in [place]), mod AS, mod AI.  In view of progression of aortic stenosis, without any obvious symptoms, patient underwent exercise treadmill stress test (12/2018).  He had low exercise capacity, but did not have any significant ischemic changes on EKG.  His BNP was borderline at 100.1. Recently, his Cr has increased to up to 2.7 with eGFR of 20, in 10/2009. Home BP data reviewed. Avg BP129/63, HR 67 bpm. He has also had recurrence of prostate cancer.   He has stable left leg edema.  He denies any chest pain, shortness of breath symptoms.  He has occasional dizziness on standing up too quickly, but denies any syncope.   Current Outpatient Medications on File Prior to Visit  Medication Sig Dispense Refill   amLODipine (NORVASC) 5 MG tablet TAKE 1 TABLET BY MOUTH EVERY DAY (Patient taking differently: Take 5 mg by mouth every evening. ) 90 tablet 0   aspirin EC 81 MG tablet Take 81 mg by mouth daily.     atorvastatin (LIPITOR) 80 MG tablet TAKE 1 TABLET BY MOUTH EVERYDAY AT BEDTIME (Patient taking differently: Take 80 mg by mouth every evening. ) 90 tablet 2   bisacodyl (BISACODYL) 5 MG EC tablet Take 5 mg by mouth daily.     Cholecalciferol (VITAMIN D3) 25 MCG (1000 UT) CAPS Take 1 capsule by mouth daily.      ezetimibe (ZETIA) 10 MG tablet Take 1 tablet (10 mg total) by mouth daily. 90 tablet 3   Ferrous Sulfate (IRON PO) Take 1 tablet by mouth in the morning and at bedtime. Taking Slow release Iron supplements 45 mg     metoprolol succinate (TOPROL-XL) 50 MG 24 hr tablet Take 50 mg by mouth in the morning and at bedtime. Take  with or immediately following a meal.     mineral oil-hydrophilic petrolatum (AQUAPHOR) ointment Apply 1 application topically 2 (two) times daily as needed for dry skin.      Nutritional Supplements (JUICE PLUS FIBRE PO) Take 2 capsules by mouth 2 (two) times daily. ([place])      omeprazole (PRILOSEC) 20 MG capsule Take 20 mg by mouth daily. Taking before breakfast     triamcinolone cream (KENALOG) 0.1 % Apply 1 application topically daily.     vitamin B-12 (CYANOCOBALAMIN) 500 MCG tablet Take 500 mcg by mouth daily.     vitamin C (ASCORBIC ACID) 250 MG tablet Take 250 mg by mouth daily.      vitamin E 400 UNIT capsule Take 400 Units by mouth every evening.      No current facility-administered medications on file prior to visit.    Cardiovascular & other pertient studies:  Echocardiogram 12/29/2019:  Left ventricle cavity is normal in size. Moderate concentric hypertrophy  of the left ventricle. Normal global wall motion. Normal LV systolic  function with EF 68%.  Left atrial cavity is moderately dilated.  Trileaflet aortic valve with moderate calcifciation. Moderate aortic  stenosis. Peak velocity 3.2 m/s, mean gradient 24 mmHg, AVA 1 cm2,  dimensional index 0.27. Moderate-to-severe aortic regurgitation.  Moderate (Grade III) mitral  regurgitation.  Mild to moderate tricuspid regurgitation. Estimated pulmonary artery  systolic pressure 30 mmHg.  Compared to previous study on 07/08/2019, mitral and aortic regurgitation  severity has increased.   EKG 08/01/2019: Sinus rhythm 61 bpm  Left atrial enlargement Right bundle branch block  Exercise treadmill stress test 01/06/2019: Exercise treadmill stress test performed using modified Bruce protocol.  Patient reached 2.6 METS, and 107% of age predicted maximum heart rate.  Exercise capacity was low.  No chest pain reported. Normal heart rare response. BP response was flat with no significant increase with low level exercise.   Stress EKG revealed no ischemic changes. Relatively unchanged blood pressure response with exercise without presyncope, syncope, or ischemic EKG changes. Intermediate risk study. Recommend clinical correlation   Carotid artery duplex  12/26/2018: Stenosis in the right internal carotid artery (16-49%).  Stenosis in the right external carotid artery (<50%). Minimal plaque in the left internal carotid artery (1-15%).  Stenosis in the left external carotid artery (<50%). Antegrade right vertebral artery flow.  Follow up in one year is appropriate if clinically indicated.   Recent labs: 10/2019: BUN/Cr 65/2.7. eGFR 20  09/17/2019: Glucose 99, BUN/Cr 60/2.6. EGFR 21. Na/K 138/4.3.   07/08/2019: Glucose 106, BUN/Cr 40/2.0. EGFR 29. Na/K 138/5.3. Rest of the CMP normal H/H 11/37.2.  Chol 112, TG 73, HDL 35, LDL 62  01/13/2019: Glucose 85, BUN/Cr 40/2.02. EGFR 29. Na/K 138/5.3. Rest of the CMP normal  12/26/2018: H/H 3.68/11.3. MCV 92. Platelets 179.  BNP 100.1  Lipid panel 04/2017: Chol 110, TG 55, HDL 38, LDL 55    Review of Systems  Cardiovascular: Negative for chest pain, dyspnea on exertion, leg swelling (Chronic, stable, unilateral left leg swelling), palpitations and syncope.       Vitals:   01/05/20 0958 01/05/20 1003  BP: (!) 168/66 (!) 161/65  Pulse: 69 69  Resp: 17   SpO2: 98%      Body mass index is 20.81 kg/m. Filed Weights   01/05/20 0958  Weight: 145 lb (65.8 kg)     Objective:   Physical Exam Vitals and nursing note reviewed.  Constitutional:      General: He is not in acute distress. Neck:     Vascular: No JVD.  Cardiovascular:     Rate and Rhythm: Normal rate and regular rhythm.     Pulses: Intact distal pulses.     Heart sounds: Murmur heard.  Harsh midsystolic murmur is present with a grade of 2/6 at the upper right sternal border radiating to the neck. High-pitched blowing decrescendo early diastolic murmur is present with a grade of 2/4  at the upper right sternal border radiating to the apex.   Pulmonary:     Effort: Pulmonary effort is normal.     Breath sounds: Normal breath sounds. No wheezing or rales.           Assessment & Recommendations:   86 y.o. Caucasian male with hypertension, CKD 4, CAD MI 2002, CABGX3 (LIMA-LAD, SVG-OM, SVG-PDA-2002 in [place]), mod AS, mod AI.  Moderate aortic stenosis, moderate aortic regurgitation: Clinically asymptomatic.  While patient has had left leg swelling for several years, this is likely due to his venous insufficiency.  No significant EKG changes on modified Bruce protocol exercise treadmill stress test (12/2018).  In absence of any definite cardiac symptoms, and worsening of renal function and prostate cancer, conservative management is recommended.  Repeat echocardiogram in 6 months.  CAD s/p CABG: Continue Aspirin, statin, blood pressure control  Hypertension:  Blood pressure elevated today.  However, home readings are much lower.  No change made today.    CKD 4: Continue f/u w/nephrology.  F/u after echocardiogram in 06/2020.  Nigel Mormon, MD [facility] [place] Pager: [phone] Office: [phone] If no answer Cell [phone]

## 2020-01-09 DIAGNOSIS — E875 Hyperkalemia: Secondary | ICD-10-CM | POA: Diagnosis not present

## 2020-01-09 DIAGNOSIS — D631 Anemia in chronic kidney disease: Secondary | ICD-10-CM | POA: Diagnosis not present

## 2020-01-09 DIAGNOSIS — R399 Unspecified symptoms and signs involving the genitourinary system: Secondary | ICD-10-CM | POA: Diagnosis not present

## 2020-01-09 DIAGNOSIS — I129 Hypertensive chronic kidney disease with stage 1 through stage 4 chronic kidney disease, or unspecified chronic kidney disease: Secondary | ICD-10-CM | POA: Diagnosis not present

## 2020-01-09 DIAGNOSIS — N183 Chronic kidney disease, stage 3 unspecified: Secondary | ICD-10-CM | POA: Diagnosis not present

## 2020-01-18 DIAGNOSIS — I10 Essential (primary) hypertension: Secondary | ICD-10-CM | POA: Diagnosis not present

## 2020-01-26 DIAGNOSIS — Z23 Encounter for immunization: Secondary | ICD-10-CM | POA: Diagnosis not present

## 2020-02-10 ENCOUNTER — Other Ambulatory Visit: Payer: Self-pay | Admitting: Cardiology

## 2020-02-17 DIAGNOSIS — I10 Essential (primary) hypertension: Secondary | ICD-10-CM | POA: Diagnosis not present

## 2020-03-19 DIAGNOSIS — I10 Essential (primary) hypertension: Secondary | ICD-10-CM | POA: Diagnosis not present

## 2020-03-31 ENCOUNTER — Other Ambulatory Visit: Payer: Self-pay | Admitting: Cardiology

## 2020-04-19 DIAGNOSIS — I10 Essential (primary) hypertension: Secondary | ICD-10-CM | POA: Diagnosis not present

## 2020-05-20 DIAGNOSIS — I10 Essential (primary) hypertension: Secondary | ICD-10-CM | POA: Diagnosis not present

## 2020-06-19 DIAGNOSIS — I10 Essential (primary) hypertension: Secondary | ICD-10-CM | POA: Diagnosis not present

## 2020-06-19 DIAGNOSIS — C61 Malignant neoplasm of prostate: Secondary | ICD-10-CM | POA: Diagnosis not present

## 2020-06-21 DIAGNOSIS — C61 Malignant neoplasm of prostate: Secondary | ICD-10-CM | POA: Diagnosis not present

## 2020-06-23 DIAGNOSIS — D631 Anemia in chronic kidney disease: Secondary | ICD-10-CM | POA: Diagnosis not present

## 2020-06-23 DIAGNOSIS — N183 Chronic kidney disease, stage 3 unspecified: Secondary | ICD-10-CM | POA: Diagnosis not present

## 2020-06-23 DIAGNOSIS — I129 Hypertensive chronic kidney disease with stage 1 through stage 4 chronic kidney disease, or unspecified chronic kidney disease: Secondary | ICD-10-CM | POA: Diagnosis not present

## 2020-06-23 DIAGNOSIS — E875 Hyperkalemia: Secondary | ICD-10-CM | POA: Diagnosis not present

## 2020-06-23 DIAGNOSIS — D509 Iron deficiency anemia, unspecified: Secondary | ICD-10-CM | POA: Diagnosis not present

## 2020-06-23 DIAGNOSIS — R399 Unspecified symptoms and signs involving the genitourinary system: Secondary | ICD-10-CM | POA: Diagnosis not present

## 2020-06-28 DIAGNOSIS — N1330 Unspecified hydronephrosis: Secondary | ICD-10-CM | POA: Diagnosis not present

## 2020-06-28 DIAGNOSIS — C61 Malignant neoplasm of prostate: Secondary | ICD-10-CM | POA: Diagnosis not present

## 2020-06-29 ENCOUNTER — Other Ambulatory Visit: Payer: Self-pay

## 2020-06-29 DIAGNOSIS — I352 Nonrheumatic aortic (valve) stenosis with insufficiency: Secondary | ICD-10-CM

## 2020-06-29 DIAGNOSIS — I2510 Atherosclerotic heart disease of native coronary artery without angina pectoris: Secondary | ICD-10-CM

## 2020-06-30 ENCOUNTER — Other Ambulatory Visit: Payer: Self-pay

## 2020-06-30 ENCOUNTER — Ambulatory Visit: Payer: Medicare Other

## 2020-06-30 DIAGNOSIS — I352 Nonrheumatic aortic (valve) stenosis with insufficiency: Secondary | ICD-10-CM

## 2020-06-30 DIAGNOSIS — I2510 Atherosclerotic heart disease of native coronary artery without angina pectoris: Secondary | ICD-10-CM

## 2020-07-07 ENCOUNTER — Ambulatory Visit: Payer: Medicare Other | Admitting: Cardiology

## 2020-07-08 ENCOUNTER — Ambulatory Visit: Payer: Medicare Other | Admitting: Cardiology

## 2020-07-08 ENCOUNTER — Encounter: Payer: Self-pay | Admitting: Cardiology

## 2020-07-08 ENCOUNTER — Other Ambulatory Visit: Payer: Self-pay

## 2020-07-08 VITALS — BP 133/54 | HR 61 | Temp 97.6°F | Resp 17 | Ht 70.0 in | Wt 140.6 lb

## 2020-07-08 DIAGNOSIS — I352 Nonrheumatic aortic (valve) stenosis with insufficiency: Secondary | ICD-10-CM | POA: Diagnosis not present

## 2020-07-08 DIAGNOSIS — I2510 Atherosclerotic heart disease of native coronary artery without angina pectoris: Secondary | ICD-10-CM | POA: Diagnosis not present

## 2020-07-08 DIAGNOSIS — I351 Nonrheumatic aortic (valve) insufficiency: Secondary | ICD-10-CM

## 2020-07-08 NOTE — Progress Notes (Signed)
Follow up visit  Subjective:   Trevor Cooper., male    DOB: May 21, 1931, 87 y.o.   MRN: [id number]   HPI   Chief Complaint  Patient presents with  . Asymptomatic bilateral carotid artery stenosis  . Hypertension  . Follow-up    6 month    87 y.o. Caucasian male with hypertension, CKD 4, CAD MI 2002, CABGX3 (LIMA-LAD, SVG-OM, SVG-PDA-2002 in [place]), mod AS, mod AI.  Patient recently had increased Cr to upto 4. He was seen by his nephrologist Dr. Moshe Cipro. His PSA has also increased to 6. Given progression of his CKD that was felt to be irreversible, Dr. Moshe Cipro recommended hospice. He has had overall reduced functional capacity.   Current Outpatient Medications on File Prior to Visit  Medication Sig Dispense Refill  . amLODipine (NORVASC) 5 MG tablet TAKE 1 TABLET BY MOUTH EVERY DAY 90 tablet 0  . aspirin EC 81 MG tablet Take 81 mg by mouth daily.    Marland Kitchen atorvastatin (LIPITOR) 80 MG tablet TAKE 1 TABLET BY MOUTH EVERYDAY AT BEDTIME (Patient taking differently: Take 80 mg by mouth every evening. ) 90 tablet 2  . bisacodyl (BISACODYL) 5 MG EC tablet Take 5 mg by mouth daily.    . Cholecalciferol (VITAMIN D3) 25 MCG (1000 UT) CAPS Take 1 capsule by mouth daily.     Marland Kitchen ezetimibe (ZETIA) 10 MG tablet Take 1 tablet (10 mg total) by mouth daily. 90 tablet 3  . Ferrous Sulfate (IRON PO) Take 1 tablet by mouth in the morning and at bedtime. Taking Slow release Iron supplements 45 mg    . metoprolol succinate (TOPROL-XL) 50 MG 24 hr tablet Take 50 mg by mouth in the morning and at bedtime. Take with or immediately following a meal.    . mineral oil-hydrophilic petrolatum (AQUAPHOR) ointment Apply 1 application topically 2 (two) times daily as needed for dry skin.     . Nutritional Supplements (JUICE PLUS FIBRE PO) Take 2 capsules by mouth 2 (two) times daily. (Garden)     . omeprazole (PRILOSEC) 20 MG capsule Take 20 mg by mouth daily. Taking before breakfast    . triamcinolone  cream (KENALOG) 0.1 % Apply 1 application topically daily.    . vitamin B-12 (CYANOCOBALAMIN) 500 MCG tablet Take 500 mcg by mouth daily.    . vitamin C (ASCORBIC ACID) 250 MG tablet Take 250 mg by mouth daily.     . vitamin E 400 UNIT capsule Take 400 Units by mouth every evening.      No current facility-administered medications on file prior to visit.    Cardiovascular & other pertient studies:  EKG 07/08/2020: Sinus rhythm 60 bpm  Right bundle branch block  Echocardiogram 06/30/2020:  Normal LV systolic function with visual EF 55-60%. Left ventricle cavity  is normal in size. Moderate left ventricular hypertrophy. Normal global  wall motion. Indeterminate diastolic filling pattern, normal LAP.  Left atrial cavity is mildly dilated.  Moderate (Grade III) aortic regurgitation.  Moderate to severe aortic stenosis. Peak velocity 3.24ms, Peak Gradient  51 mmHg, Mean Gradient 33 mmHg, AVA 0.80 cm, DI 0.20.  Mild (Grade I) mitral regurgitation.  Mild tricuspid regurgitation. No evidence of pulmonary hypertension.  Mild pulmonic regurgitation.  Prior study dated 12/29/2019: LVEF 68%, moderate LVH, moderate LAE,  Moderate AS (Peak velocity 3.2 m/s, mean gradient 24 mmHg, AVA 1 cm2, DI  0.27), Moderate-to-severe AR, Moderate MR, otherwise no significant  change.   EKG 08/01/2019: Sinus rhythm  61 bpm  Left atrial enlargement Right bundle branch block  Exercise treadmill stress test 01/06/2019: Exercise treadmill stress test performed using modified Bruce protocol.  Patient reached 2.6 METS, and 107% of age predicted maximum heart rate.  Exercise capacity was low.  No chest pain reported. Normal heart rare response. BP response was flat with no significant increase with low level exercise.  Stress EKG revealed no ischemic changes. Relatively unchanged blood pressure response with exercise without presyncope, syncope, or ischemic EKG changes. Intermediate risk study. Recommend clinical  correlation   Carotid artery duplex  12/26/2018: Stenosis in the right internal carotid artery (16-49%).  Stenosis in the right external carotid artery (<50%). Minimal plaque in the left internal carotid artery (1-15%).  Stenosis in the left external carotid artery (<50%). Antegrade right vertebral artery flow.  Follow up in one year is appropriate if clinically indicated.   Recent labs: 10/2019: BUN/Cr 65/2.7. eGFR 20  09/17/2019: Glucose 99, BUN/Cr 60/2.6. EGFR 21. Na/K 138/4.3.   07/08/2019: Glucose 106, BUN/Cr 40/2.0. EGFR 29. Na/K 138/5.3. Rest of the CMP normal H/H 11/37.2.  Chol 112, TG 73, HDL 35, LDL 62  01/13/2019: Glucose 85, BUN/Cr 40/2.02. EGFR 29. Na/K 138/5.3. Rest of the CMP normal  12/26/2018: H/H 3.68/11.3. MCV 92. Platelets 179.  BNP 100.1  Lipid panel 04/2017: Chol 110, TG 55, HDL 38, LDL 55    Review of Systems  Cardiovascular: Positive for dyspnea on exertion. Negative for chest pain, leg swelling (Chronic, stable, unilateral left leg swelling), palpitations and syncope.       Vitals:   07/08/20 1157  BP: (!) 133/54  Pulse: 61  Resp: 17  Temp: 97.6 F (36.4 C)  SpO2: 100%     Body mass index is 20.17 kg/m. Filed Weights   07/08/20 1157  Weight: 140 lb 9.6 oz (63.8 kg)     Objective:   Physical Exam Vitals and nursing note reviewed.  Constitutional:      General: He is not in acute distress. Neck:     Vascular: No JVD.  Cardiovascular:     Rate and Rhythm: Normal rate and regular rhythm.     Pulses: Intact distal pulses.     Heart sounds: Murmur heard.   Harsh midsystolic murmur is present with a grade of 2/6 at the upper right sternal border radiating to the neck. High-pitched blowing decrescendo early diastolic murmur is present with a grade of 2/4 at the upper right sternal border radiating to the apex.   Pulmonary:     Effort: Pulmonary effort is normal.     Breath sounds: Normal breath sounds. No wheezing or rales.            Assessment & Recommendations:   87 y.o. Caucasian male with hypertension, CKD 4, CAD MI 2002, CABGX3 (LIMA-LAD, SVG-OM, SVG-PDA-2002 in [place]), mod AS, mod AI.  Moderate aortic stenosis, moderate aortic regurgitation: Progression since previous echocardiogram. Given his worsening CKD, not being candidate for dialysis. I do not recommend further workup.   Continue hospice care.  Nigel Mormon, MD [facility] Pager: [phone] Office: [phone] If no answer Cell [phone]

## 2020-07-13 DIAGNOSIS — D631 Anemia in chronic kidney disease: Secondary | ICD-10-CM | POA: Diagnosis not present

## 2020-07-13 DIAGNOSIS — I120 Hypertensive chronic kidney disease with stage 5 chronic kidney disease or end stage renal disease: Secondary | ICD-10-CM | POA: Diagnosis not present

## 2020-07-13 DIAGNOSIS — I252 Old myocardial infarction: Secondary | ICD-10-CM | POA: Diagnosis not present

## 2020-07-13 DIAGNOSIS — R011 Cardiac murmur, unspecified: Secondary | ICD-10-CM | POA: Diagnosis not present

## 2020-07-13 DIAGNOSIS — R634 Abnormal weight loss: Secondary | ICD-10-CM | POA: Diagnosis not present

## 2020-07-13 DIAGNOSIS — N186 End stage renal disease: Secondary | ICD-10-CM | POA: Diagnosis not present

## 2020-07-13 DIAGNOSIS — L309 Dermatitis, unspecified: Secondary | ICD-10-CM | POA: Diagnosis not present

## 2020-07-13 DIAGNOSIS — E785 Hyperlipidemia, unspecified: Secondary | ICD-10-CM | POA: Diagnosis not present

## 2020-07-13 DIAGNOSIS — K219 Gastro-esophageal reflux disease without esophagitis: Secondary | ICD-10-CM | POA: Diagnosis not present

## 2020-07-13 DIAGNOSIS — Z8042 Family history of malignant neoplasm of prostate: Secondary | ICD-10-CM | POA: Diagnosis not present

## 2020-07-13 DIAGNOSIS — I2510 Atherosclerotic heart disease of native coronary artery without angina pectoris: Secondary | ICD-10-CM | POA: Diagnosis not present

## 2020-07-13 DIAGNOSIS — Z6820 Body mass index (BMI) 20.0-20.9, adult: Secondary | ICD-10-CM | POA: Diagnosis not present

## 2020-07-14 DIAGNOSIS — D631 Anemia in chronic kidney disease: Secondary | ICD-10-CM | POA: Diagnosis not present

## 2020-07-14 DIAGNOSIS — I2510 Atherosclerotic heart disease of native coronary artery without angina pectoris: Secondary | ICD-10-CM | POA: Diagnosis not present

## 2020-07-14 DIAGNOSIS — R011 Cardiac murmur, unspecified: Secondary | ICD-10-CM | POA: Diagnosis not present

## 2020-07-14 DIAGNOSIS — N186 End stage renal disease: Secondary | ICD-10-CM | POA: Diagnosis not present

## 2020-07-14 DIAGNOSIS — E785 Hyperlipidemia, unspecified: Secondary | ICD-10-CM | POA: Diagnosis not present

## 2020-07-14 DIAGNOSIS — I120 Hypertensive chronic kidney disease with stage 5 chronic kidney disease or end stage renal disease: Secondary | ICD-10-CM | POA: Diagnosis not present

## 2020-07-16 DIAGNOSIS — N186 End stage renal disease: Secondary | ICD-10-CM | POA: Diagnosis not present

## 2020-07-16 DIAGNOSIS — I2510 Atherosclerotic heart disease of native coronary artery without angina pectoris: Secondary | ICD-10-CM | POA: Diagnosis not present

## 2020-07-16 DIAGNOSIS — E785 Hyperlipidemia, unspecified: Secondary | ICD-10-CM | POA: Diagnosis not present

## 2020-07-16 DIAGNOSIS — D631 Anemia in chronic kidney disease: Secondary | ICD-10-CM | POA: Diagnosis not present

## 2020-07-16 DIAGNOSIS — I120 Hypertensive chronic kidney disease with stage 5 chronic kidney disease or end stage renal disease: Secondary | ICD-10-CM | POA: Diagnosis not present

## 2020-07-16 DIAGNOSIS — R011 Cardiac murmur, unspecified: Secondary | ICD-10-CM | POA: Diagnosis not present

## 2020-07-18 DIAGNOSIS — I2510 Atherosclerotic heart disease of native coronary artery without angina pectoris: Secondary | ICD-10-CM | POA: Diagnosis not present

## 2020-07-18 DIAGNOSIS — Z8042 Family history of malignant neoplasm of prostate: Secondary | ICD-10-CM | POA: Diagnosis not present

## 2020-07-18 DIAGNOSIS — I252 Old myocardial infarction: Secondary | ICD-10-CM | POA: Diagnosis not present

## 2020-07-18 DIAGNOSIS — R011 Cardiac murmur, unspecified: Secondary | ICD-10-CM | POA: Diagnosis not present

## 2020-07-18 DIAGNOSIS — R634 Abnormal weight loss: Secondary | ICD-10-CM | POA: Diagnosis not present

## 2020-07-18 DIAGNOSIS — I120 Hypertensive chronic kidney disease with stage 5 chronic kidney disease or end stage renal disease: Secondary | ICD-10-CM | POA: Diagnosis not present

## 2020-07-18 DIAGNOSIS — L309 Dermatitis, unspecified: Secondary | ICD-10-CM | POA: Diagnosis not present

## 2020-07-18 DIAGNOSIS — K219 Gastro-esophageal reflux disease without esophagitis: Secondary | ICD-10-CM | POA: Diagnosis not present

## 2020-07-18 DIAGNOSIS — Z6820 Body mass index (BMI) 20.0-20.9, adult: Secondary | ICD-10-CM | POA: Diagnosis not present

## 2020-07-18 DIAGNOSIS — D631 Anemia in chronic kidney disease: Secondary | ICD-10-CM | POA: Diagnosis not present

## 2020-07-18 DIAGNOSIS — E785 Hyperlipidemia, unspecified: Secondary | ICD-10-CM | POA: Diagnosis not present

## 2020-07-18 DIAGNOSIS — N186 End stage renal disease: Secondary | ICD-10-CM | POA: Diagnosis not present

## 2020-07-19 DIAGNOSIS — E785 Hyperlipidemia, unspecified: Secondary | ICD-10-CM | POA: Diagnosis not present

## 2020-07-19 DIAGNOSIS — D631 Anemia in chronic kidney disease: Secondary | ICD-10-CM | POA: Diagnosis not present

## 2020-07-19 DIAGNOSIS — I10 Essential (primary) hypertension: Secondary | ICD-10-CM | POA: Diagnosis not present

## 2020-07-19 DIAGNOSIS — N186 End stage renal disease: Secondary | ICD-10-CM | POA: Diagnosis not present

## 2020-07-19 DIAGNOSIS — R011 Cardiac murmur, unspecified: Secondary | ICD-10-CM | POA: Diagnosis not present

## 2020-07-19 DIAGNOSIS — I120 Hypertensive chronic kidney disease with stage 5 chronic kidney disease or end stage renal disease: Secondary | ICD-10-CM | POA: Diagnosis not present

## 2020-07-19 DIAGNOSIS — I2510 Atherosclerotic heart disease of native coronary artery without angina pectoris: Secondary | ICD-10-CM | POA: Diagnosis not present

## 2020-07-21 DIAGNOSIS — I2510 Atherosclerotic heart disease of native coronary artery without angina pectoris: Secondary | ICD-10-CM | POA: Diagnosis not present

## 2020-07-21 DIAGNOSIS — D631 Anemia in chronic kidney disease: Secondary | ICD-10-CM | POA: Diagnosis not present

## 2020-07-21 DIAGNOSIS — I120 Hypertensive chronic kidney disease with stage 5 chronic kidney disease or end stage renal disease: Secondary | ICD-10-CM | POA: Diagnosis not present

## 2020-07-21 DIAGNOSIS — N186 End stage renal disease: Secondary | ICD-10-CM | POA: Diagnosis not present

## 2020-07-21 DIAGNOSIS — R011 Cardiac murmur, unspecified: Secondary | ICD-10-CM | POA: Diagnosis not present

## 2020-07-21 DIAGNOSIS — E785 Hyperlipidemia, unspecified: Secondary | ICD-10-CM | POA: Diagnosis not present

## 2020-07-27 DIAGNOSIS — E785 Hyperlipidemia, unspecified: Secondary | ICD-10-CM | POA: Diagnosis not present

## 2020-07-27 DIAGNOSIS — I2510 Atherosclerotic heart disease of native coronary artery without angina pectoris: Secondary | ICD-10-CM | POA: Diagnosis not present

## 2020-07-27 DIAGNOSIS — D631 Anemia in chronic kidney disease: Secondary | ICD-10-CM | POA: Diagnosis not present

## 2020-07-27 DIAGNOSIS — I120 Hypertensive chronic kidney disease with stage 5 chronic kidney disease or end stage renal disease: Secondary | ICD-10-CM | POA: Diagnosis not present

## 2020-07-27 DIAGNOSIS — N186 End stage renal disease: Secondary | ICD-10-CM | POA: Diagnosis not present

## 2020-07-27 DIAGNOSIS — R011 Cardiac murmur, unspecified: Secondary | ICD-10-CM | POA: Diagnosis not present

## 2020-07-29 DIAGNOSIS — R011 Cardiac murmur, unspecified: Secondary | ICD-10-CM | POA: Diagnosis not present

## 2020-07-29 DIAGNOSIS — E785 Hyperlipidemia, unspecified: Secondary | ICD-10-CM | POA: Diagnosis not present

## 2020-07-29 DIAGNOSIS — I120 Hypertensive chronic kidney disease with stage 5 chronic kidney disease or end stage renal disease: Secondary | ICD-10-CM | POA: Diagnosis not present

## 2020-07-29 DIAGNOSIS — N186 End stage renal disease: Secondary | ICD-10-CM | POA: Diagnosis not present

## 2020-07-29 DIAGNOSIS — D631 Anemia in chronic kidney disease: Secondary | ICD-10-CM | POA: Diagnosis not present

## 2020-07-29 DIAGNOSIS — I2510 Atherosclerotic heart disease of native coronary artery without angina pectoris: Secondary | ICD-10-CM | POA: Diagnosis not present

## 2020-07-30 DIAGNOSIS — I2510 Atherosclerotic heart disease of native coronary artery without angina pectoris: Secondary | ICD-10-CM | POA: Diagnosis not present

## 2020-07-30 DIAGNOSIS — I120 Hypertensive chronic kidney disease with stage 5 chronic kidney disease or end stage renal disease: Secondary | ICD-10-CM | POA: Diagnosis not present

## 2020-07-30 DIAGNOSIS — D631 Anemia in chronic kidney disease: Secondary | ICD-10-CM | POA: Diagnosis not present

## 2020-07-30 DIAGNOSIS — E785 Hyperlipidemia, unspecified: Secondary | ICD-10-CM | POA: Diagnosis not present

## 2020-07-30 DIAGNOSIS — N186 End stage renal disease: Secondary | ICD-10-CM | POA: Diagnosis not present

## 2020-07-30 DIAGNOSIS — R011 Cardiac murmur, unspecified: Secondary | ICD-10-CM | POA: Diagnosis not present

## 2020-08-05 DIAGNOSIS — E785 Hyperlipidemia, unspecified: Secondary | ICD-10-CM | POA: Diagnosis not present

## 2020-08-05 DIAGNOSIS — N186 End stage renal disease: Secondary | ICD-10-CM | POA: Diagnosis not present

## 2020-08-05 DIAGNOSIS — I120 Hypertensive chronic kidney disease with stage 5 chronic kidney disease or end stage renal disease: Secondary | ICD-10-CM | POA: Diagnosis not present

## 2020-08-05 DIAGNOSIS — I2510 Atherosclerotic heart disease of native coronary artery without angina pectoris: Secondary | ICD-10-CM | POA: Diagnosis not present

## 2020-08-05 DIAGNOSIS — R011 Cardiac murmur, unspecified: Secondary | ICD-10-CM | POA: Diagnosis not present

## 2020-08-05 DIAGNOSIS — D631 Anemia in chronic kidney disease: Secondary | ICD-10-CM | POA: Diagnosis not present

## 2020-08-06 ENCOUNTER — Other Ambulatory Visit: Payer: Self-pay | Admitting: Cardiology

## 2020-08-12 DIAGNOSIS — R011 Cardiac murmur, unspecified: Secondary | ICD-10-CM | POA: Diagnosis not present

## 2020-08-12 DIAGNOSIS — E785 Hyperlipidemia, unspecified: Secondary | ICD-10-CM | POA: Diagnosis not present

## 2020-08-12 DIAGNOSIS — I2510 Atherosclerotic heart disease of native coronary artery without angina pectoris: Secondary | ICD-10-CM | POA: Diagnosis not present

## 2020-08-12 DIAGNOSIS — D631 Anemia in chronic kidney disease: Secondary | ICD-10-CM | POA: Diagnosis not present

## 2020-08-12 DIAGNOSIS — N186 End stage renal disease: Secondary | ICD-10-CM | POA: Diagnosis not present

## 2020-08-12 DIAGNOSIS — I120 Hypertensive chronic kidney disease with stage 5 chronic kidney disease or end stage renal disease: Secondary | ICD-10-CM | POA: Diagnosis not present

## 2020-08-18 DIAGNOSIS — L309 Dermatitis, unspecified: Secondary | ICD-10-CM | POA: Diagnosis not present

## 2020-08-18 DIAGNOSIS — R634 Abnormal weight loss: Secondary | ICD-10-CM | POA: Diagnosis not present

## 2020-08-18 DIAGNOSIS — I2510 Atherosclerotic heart disease of native coronary artery without angina pectoris: Secondary | ICD-10-CM | POA: Diagnosis not present

## 2020-08-18 DIAGNOSIS — R011 Cardiac murmur, unspecified: Secondary | ICD-10-CM | POA: Diagnosis not present

## 2020-08-18 DIAGNOSIS — K219 Gastro-esophageal reflux disease without esophagitis: Secondary | ICD-10-CM | POA: Diagnosis not present

## 2020-08-18 DIAGNOSIS — I120 Hypertensive chronic kidney disease with stage 5 chronic kidney disease or end stage renal disease: Secondary | ICD-10-CM | POA: Diagnosis not present

## 2020-08-18 DIAGNOSIS — N186 End stage renal disease: Secondary | ICD-10-CM | POA: Diagnosis not present

## 2020-08-18 DIAGNOSIS — D631 Anemia in chronic kidney disease: Secondary | ICD-10-CM | POA: Diagnosis not present

## 2020-08-18 DIAGNOSIS — Z6820 Body mass index (BMI) 20.0-20.9, adult: Secondary | ICD-10-CM | POA: Diagnosis not present

## 2020-08-18 DIAGNOSIS — E785 Hyperlipidemia, unspecified: Secondary | ICD-10-CM | POA: Diagnosis not present

## 2020-08-18 DIAGNOSIS — Z8042 Family history of malignant neoplasm of prostate: Secondary | ICD-10-CM | POA: Diagnosis not present

## 2020-08-18 DIAGNOSIS — I252 Old myocardial infarction: Secondary | ICD-10-CM | POA: Diagnosis not present

## 2020-08-19 DIAGNOSIS — I120 Hypertensive chronic kidney disease with stage 5 chronic kidney disease or end stage renal disease: Secondary | ICD-10-CM | POA: Diagnosis not present

## 2020-08-19 DIAGNOSIS — I2510 Atherosclerotic heart disease of native coronary artery without angina pectoris: Secondary | ICD-10-CM | POA: Diagnosis not present

## 2020-08-19 DIAGNOSIS — R011 Cardiac murmur, unspecified: Secondary | ICD-10-CM | POA: Diagnosis not present

## 2020-08-19 DIAGNOSIS — D631 Anemia in chronic kidney disease: Secondary | ICD-10-CM | POA: Diagnosis not present

## 2020-08-19 DIAGNOSIS — N186 End stage renal disease: Secondary | ICD-10-CM | POA: Diagnosis not present

## 2020-08-19 DIAGNOSIS — E785 Hyperlipidemia, unspecified: Secondary | ICD-10-CM | POA: Diagnosis not present

## 2020-08-26 DIAGNOSIS — I2510 Atherosclerotic heart disease of native coronary artery without angina pectoris: Secondary | ICD-10-CM | POA: Diagnosis not present

## 2020-08-26 DIAGNOSIS — I120 Hypertensive chronic kidney disease with stage 5 chronic kidney disease or end stage renal disease: Secondary | ICD-10-CM | POA: Diagnosis not present

## 2020-08-26 DIAGNOSIS — D631 Anemia in chronic kidney disease: Secondary | ICD-10-CM | POA: Diagnosis not present

## 2020-08-26 DIAGNOSIS — N186 End stage renal disease: Secondary | ICD-10-CM | POA: Diagnosis not present

## 2020-08-26 DIAGNOSIS — E785 Hyperlipidemia, unspecified: Secondary | ICD-10-CM | POA: Diagnosis not present

## 2020-08-26 DIAGNOSIS — R011 Cardiac murmur, unspecified: Secondary | ICD-10-CM | POA: Diagnosis not present

## 2020-08-28 DIAGNOSIS — I2510 Atherosclerotic heart disease of native coronary artery without angina pectoris: Secondary | ICD-10-CM | POA: Diagnosis not present

## 2020-08-28 DIAGNOSIS — E785 Hyperlipidemia, unspecified: Secondary | ICD-10-CM | POA: Diagnosis not present

## 2020-08-28 DIAGNOSIS — R011 Cardiac murmur, unspecified: Secondary | ICD-10-CM | POA: Diagnosis not present

## 2020-08-28 DIAGNOSIS — D631 Anemia in chronic kidney disease: Secondary | ICD-10-CM | POA: Diagnosis not present

## 2020-08-28 DIAGNOSIS — I120 Hypertensive chronic kidney disease with stage 5 chronic kidney disease or end stage renal disease: Secondary | ICD-10-CM | POA: Diagnosis not present

## 2020-08-28 DIAGNOSIS — N186 End stage renal disease: Secondary | ICD-10-CM | POA: Diagnosis not present

## 2020-09-02 DIAGNOSIS — E785 Hyperlipidemia, unspecified: Secondary | ICD-10-CM | POA: Diagnosis not present

## 2020-09-02 DIAGNOSIS — N186 End stage renal disease: Secondary | ICD-10-CM | POA: Diagnosis not present

## 2020-09-02 DIAGNOSIS — I120 Hypertensive chronic kidney disease with stage 5 chronic kidney disease or end stage renal disease: Secondary | ICD-10-CM | POA: Diagnosis not present

## 2020-09-02 DIAGNOSIS — D631 Anemia in chronic kidney disease: Secondary | ICD-10-CM | POA: Diagnosis not present

## 2020-09-02 DIAGNOSIS — R011 Cardiac murmur, unspecified: Secondary | ICD-10-CM | POA: Diagnosis not present

## 2020-09-02 DIAGNOSIS — I2510 Atherosclerotic heart disease of native coronary artery without angina pectoris: Secondary | ICD-10-CM | POA: Diagnosis not present

## 2020-09-09 DIAGNOSIS — E785 Hyperlipidemia, unspecified: Secondary | ICD-10-CM | POA: Diagnosis not present

## 2020-09-09 DIAGNOSIS — D631 Anemia in chronic kidney disease: Secondary | ICD-10-CM | POA: Diagnosis not present

## 2020-09-09 DIAGNOSIS — R011 Cardiac murmur, unspecified: Secondary | ICD-10-CM | POA: Diagnosis not present

## 2020-09-09 DIAGNOSIS — I120 Hypertensive chronic kidney disease with stage 5 chronic kidney disease or end stage renal disease: Secondary | ICD-10-CM | POA: Diagnosis not present

## 2020-09-09 DIAGNOSIS — I2510 Atherosclerotic heart disease of native coronary artery without angina pectoris: Secondary | ICD-10-CM | POA: Diagnosis not present

## 2020-09-09 DIAGNOSIS — N186 End stage renal disease: Secondary | ICD-10-CM | POA: Diagnosis not present

## 2020-09-15 DIAGNOSIS — D631 Anemia in chronic kidney disease: Secondary | ICD-10-CM | POA: Diagnosis not present

## 2020-09-15 DIAGNOSIS — N186 End stage renal disease: Secondary | ICD-10-CM | POA: Diagnosis not present

## 2020-09-15 DIAGNOSIS — I2510 Atherosclerotic heart disease of native coronary artery without angina pectoris: Secondary | ICD-10-CM | POA: Diagnosis not present

## 2020-09-15 DIAGNOSIS — I120 Hypertensive chronic kidney disease with stage 5 chronic kidney disease or end stage renal disease: Secondary | ICD-10-CM | POA: Diagnosis not present

## 2020-09-15 DIAGNOSIS — E785 Hyperlipidemia, unspecified: Secondary | ICD-10-CM | POA: Diagnosis not present

## 2020-09-15 DIAGNOSIS — R011 Cardiac murmur, unspecified: Secondary | ICD-10-CM | POA: Diagnosis not present

## 2020-09-17 DIAGNOSIS — K219 Gastro-esophageal reflux disease without esophagitis: Secondary | ICD-10-CM | POA: Diagnosis not present

## 2020-09-17 DIAGNOSIS — I252 Old myocardial infarction: Secondary | ICD-10-CM | POA: Diagnosis not present

## 2020-09-17 DIAGNOSIS — Z8042 Family history of malignant neoplasm of prostate: Secondary | ICD-10-CM | POA: Diagnosis not present

## 2020-09-17 DIAGNOSIS — N186 End stage renal disease: Secondary | ICD-10-CM | POA: Diagnosis not present

## 2020-09-17 DIAGNOSIS — R011 Cardiac murmur, unspecified: Secondary | ICD-10-CM | POA: Diagnosis not present

## 2020-09-17 DIAGNOSIS — E785 Hyperlipidemia, unspecified: Secondary | ICD-10-CM | POA: Diagnosis not present

## 2020-09-17 DIAGNOSIS — I2510 Atherosclerotic heart disease of native coronary artery without angina pectoris: Secondary | ICD-10-CM | POA: Diagnosis not present

## 2020-09-17 DIAGNOSIS — D631 Anemia in chronic kidney disease: Secondary | ICD-10-CM | POA: Diagnosis not present

## 2020-09-17 DIAGNOSIS — L309 Dermatitis, unspecified: Secondary | ICD-10-CM | POA: Diagnosis not present

## 2020-09-17 DIAGNOSIS — Z6820 Body mass index (BMI) 20.0-20.9, adult: Secondary | ICD-10-CM | POA: Diagnosis not present

## 2020-09-17 DIAGNOSIS — R634 Abnormal weight loss: Secondary | ICD-10-CM | POA: Diagnosis not present

## 2020-09-17 DIAGNOSIS — I120 Hypertensive chronic kidney disease with stage 5 chronic kidney disease or end stage renal disease: Secondary | ICD-10-CM | POA: Diagnosis not present

## 2020-09-23 DIAGNOSIS — D631 Anemia in chronic kidney disease: Secondary | ICD-10-CM | POA: Diagnosis not present

## 2020-09-23 DIAGNOSIS — I2510 Atherosclerotic heart disease of native coronary artery without angina pectoris: Secondary | ICD-10-CM | POA: Diagnosis not present

## 2020-09-23 DIAGNOSIS — I120 Hypertensive chronic kidney disease with stage 5 chronic kidney disease or end stage renal disease: Secondary | ICD-10-CM | POA: Diagnosis not present

## 2020-09-23 DIAGNOSIS — R011 Cardiac murmur, unspecified: Secondary | ICD-10-CM | POA: Diagnosis not present

## 2020-09-23 DIAGNOSIS — N186 End stage renal disease: Secondary | ICD-10-CM | POA: Diagnosis not present

## 2020-09-23 DIAGNOSIS — E785 Hyperlipidemia, unspecified: Secondary | ICD-10-CM | POA: Diagnosis not present

## 2020-09-30 DIAGNOSIS — R011 Cardiac murmur, unspecified: Secondary | ICD-10-CM | POA: Diagnosis not present

## 2020-09-30 DIAGNOSIS — N186 End stage renal disease: Secondary | ICD-10-CM | POA: Diagnosis not present

## 2020-09-30 DIAGNOSIS — I120 Hypertensive chronic kidney disease with stage 5 chronic kidney disease or end stage renal disease: Secondary | ICD-10-CM | POA: Diagnosis not present

## 2020-09-30 DIAGNOSIS — D631 Anemia in chronic kidney disease: Secondary | ICD-10-CM | POA: Diagnosis not present

## 2020-09-30 DIAGNOSIS — E785 Hyperlipidemia, unspecified: Secondary | ICD-10-CM | POA: Diagnosis not present

## 2020-09-30 DIAGNOSIS — I2510 Atherosclerotic heart disease of native coronary artery without angina pectoris: Secondary | ICD-10-CM | POA: Diagnosis not present

## 2020-10-07 DIAGNOSIS — R011 Cardiac murmur, unspecified: Secondary | ICD-10-CM | POA: Diagnosis not present

## 2020-10-07 DIAGNOSIS — I2510 Atherosclerotic heart disease of native coronary artery without angina pectoris: Secondary | ICD-10-CM | POA: Diagnosis not present

## 2020-10-07 DIAGNOSIS — N186 End stage renal disease: Secondary | ICD-10-CM | POA: Diagnosis not present

## 2020-10-07 DIAGNOSIS — E785 Hyperlipidemia, unspecified: Secondary | ICD-10-CM | POA: Diagnosis not present

## 2020-10-07 DIAGNOSIS — D631 Anemia in chronic kidney disease: Secondary | ICD-10-CM | POA: Diagnosis not present

## 2020-10-07 DIAGNOSIS — I120 Hypertensive chronic kidney disease with stage 5 chronic kidney disease or end stage renal disease: Secondary | ICD-10-CM | POA: Diagnosis not present

## 2020-10-14 DIAGNOSIS — I120 Hypertensive chronic kidney disease with stage 5 chronic kidney disease or end stage renal disease: Secondary | ICD-10-CM | POA: Diagnosis not present

## 2020-10-14 DIAGNOSIS — E785 Hyperlipidemia, unspecified: Secondary | ICD-10-CM | POA: Diagnosis not present

## 2020-10-14 DIAGNOSIS — D631 Anemia in chronic kidney disease: Secondary | ICD-10-CM | POA: Diagnosis not present

## 2020-10-14 DIAGNOSIS — N186 End stage renal disease: Secondary | ICD-10-CM | POA: Diagnosis not present

## 2020-10-14 DIAGNOSIS — I2510 Atherosclerotic heart disease of native coronary artery without angina pectoris: Secondary | ICD-10-CM | POA: Diagnosis not present

## 2020-10-14 DIAGNOSIS — R011 Cardiac murmur, unspecified: Secondary | ICD-10-CM | POA: Diagnosis not present

## 2020-10-18 DIAGNOSIS — E785 Hyperlipidemia, unspecified: Secondary | ICD-10-CM | POA: Diagnosis not present

## 2020-10-18 DIAGNOSIS — I2510 Atherosclerotic heart disease of native coronary artery without angina pectoris: Secondary | ICD-10-CM | POA: Diagnosis not present

## 2020-10-18 DIAGNOSIS — I252 Old myocardial infarction: Secondary | ICD-10-CM | POA: Diagnosis not present

## 2020-10-18 DIAGNOSIS — Z6820 Body mass index (BMI) 20.0-20.9, adult: Secondary | ICD-10-CM | POA: Diagnosis not present

## 2020-10-18 DIAGNOSIS — D631 Anemia in chronic kidney disease: Secondary | ICD-10-CM | POA: Diagnosis not present

## 2020-10-18 DIAGNOSIS — K219 Gastro-esophageal reflux disease without esophagitis: Secondary | ICD-10-CM | POA: Diagnosis not present

## 2020-10-18 DIAGNOSIS — R011 Cardiac murmur, unspecified: Secondary | ICD-10-CM | POA: Diagnosis not present

## 2020-10-18 DIAGNOSIS — L309 Dermatitis, unspecified: Secondary | ICD-10-CM | POA: Diagnosis not present

## 2020-10-18 DIAGNOSIS — R634 Abnormal weight loss: Secondary | ICD-10-CM | POA: Diagnosis not present

## 2020-10-18 DIAGNOSIS — I120 Hypertensive chronic kidney disease with stage 5 chronic kidney disease or end stage renal disease: Secondary | ICD-10-CM | POA: Diagnosis not present

## 2020-10-18 DIAGNOSIS — N186 End stage renal disease: Secondary | ICD-10-CM | POA: Diagnosis not present

## 2020-10-18 DIAGNOSIS — Z8042 Family history of malignant neoplasm of prostate: Secondary | ICD-10-CM | POA: Diagnosis not present

## 2020-10-22 DIAGNOSIS — I120 Hypertensive chronic kidney disease with stage 5 chronic kidney disease or end stage renal disease: Secondary | ICD-10-CM | POA: Diagnosis not present

## 2020-10-22 DIAGNOSIS — E785 Hyperlipidemia, unspecified: Secondary | ICD-10-CM | POA: Diagnosis not present

## 2020-10-22 DIAGNOSIS — N186 End stage renal disease: Secondary | ICD-10-CM | POA: Diagnosis not present

## 2020-10-22 DIAGNOSIS — D631 Anemia in chronic kidney disease: Secondary | ICD-10-CM | POA: Diagnosis not present

## 2020-10-22 DIAGNOSIS — R011 Cardiac murmur, unspecified: Secondary | ICD-10-CM | POA: Diagnosis not present

## 2020-10-22 DIAGNOSIS — I2510 Atherosclerotic heart disease of native coronary artery without angina pectoris: Secondary | ICD-10-CM | POA: Diagnosis not present

## 2020-10-28 DIAGNOSIS — D631 Anemia in chronic kidney disease: Secondary | ICD-10-CM | POA: Diagnosis not present

## 2020-10-28 DIAGNOSIS — I2510 Atherosclerotic heart disease of native coronary artery without angina pectoris: Secondary | ICD-10-CM | POA: Diagnosis not present

## 2020-10-28 DIAGNOSIS — E785 Hyperlipidemia, unspecified: Secondary | ICD-10-CM | POA: Diagnosis not present

## 2020-10-28 DIAGNOSIS — R011 Cardiac murmur, unspecified: Secondary | ICD-10-CM | POA: Diagnosis not present

## 2020-10-28 DIAGNOSIS — N186 End stage renal disease: Secondary | ICD-10-CM | POA: Diagnosis not present

## 2020-10-28 DIAGNOSIS — I120 Hypertensive chronic kidney disease with stage 5 chronic kidney disease or end stage renal disease: Secondary | ICD-10-CM | POA: Diagnosis not present

## 2020-11-03 ENCOUNTER — Other Ambulatory Visit: Payer: Self-pay | Admitting: Cardiology

## 2020-11-04 DIAGNOSIS — D631 Anemia in chronic kidney disease: Secondary | ICD-10-CM | POA: Diagnosis not present

## 2020-11-04 DIAGNOSIS — I2510 Atherosclerotic heart disease of native coronary artery without angina pectoris: Secondary | ICD-10-CM | POA: Diagnosis not present

## 2020-11-04 DIAGNOSIS — E785 Hyperlipidemia, unspecified: Secondary | ICD-10-CM | POA: Diagnosis not present

## 2020-11-04 DIAGNOSIS — N186 End stage renal disease: Secondary | ICD-10-CM | POA: Diagnosis not present

## 2020-11-04 DIAGNOSIS — I120 Hypertensive chronic kidney disease with stage 5 chronic kidney disease or end stage renal disease: Secondary | ICD-10-CM | POA: Diagnosis not present

## 2020-11-04 DIAGNOSIS — R011 Cardiac murmur, unspecified: Secondary | ICD-10-CM | POA: Diagnosis not present

## 2020-11-11 DIAGNOSIS — N186 End stage renal disease: Secondary | ICD-10-CM | POA: Diagnosis not present

## 2020-11-11 DIAGNOSIS — I120 Hypertensive chronic kidney disease with stage 5 chronic kidney disease or end stage renal disease: Secondary | ICD-10-CM | POA: Diagnosis not present

## 2020-11-11 DIAGNOSIS — E785 Hyperlipidemia, unspecified: Secondary | ICD-10-CM | POA: Diagnosis not present

## 2020-11-11 DIAGNOSIS — R011 Cardiac murmur, unspecified: Secondary | ICD-10-CM | POA: Diagnosis not present

## 2020-11-11 DIAGNOSIS — I2510 Atherosclerotic heart disease of native coronary artery without angina pectoris: Secondary | ICD-10-CM | POA: Diagnosis not present

## 2020-11-11 DIAGNOSIS — D631 Anemia in chronic kidney disease: Secondary | ICD-10-CM | POA: Diagnosis not present

## 2020-11-12 DIAGNOSIS — N186 End stage renal disease: Secondary | ICD-10-CM | POA: Diagnosis not present

## 2020-11-12 DIAGNOSIS — R011 Cardiac murmur, unspecified: Secondary | ICD-10-CM | POA: Diagnosis not present

## 2020-11-12 DIAGNOSIS — I2510 Atherosclerotic heart disease of native coronary artery without angina pectoris: Secondary | ICD-10-CM | POA: Diagnosis not present

## 2020-11-12 DIAGNOSIS — E785 Hyperlipidemia, unspecified: Secondary | ICD-10-CM | POA: Diagnosis not present

## 2020-11-12 DIAGNOSIS — I120 Hypertensive chronic kidney disease with stage 5 chronic kidney disease or end stage renal disease: Secondary | ICD-10-CM | POA: Diagnosis not present

## 2020-11-12 DIAGNOSIS — D631 Anemia in chronic kidney disease: Secondary | ICD-10-CM | POA: Diagnosis not present

## 2020-11-15 DIAGNOSIS — E785 Hyperlipidemia, unspecified: Secondary | ICD-10-CM | POA: Diagnosis not present

## 2020-11-15 DIAGNOSIS — N186 End stage renal disease: Secondary | ICD-10-CM | POA: Diagnosis not present

## 2020-11-15 DIAGNOSIS — I2510 Atherosclerotic heart disease of native coronary artery without angina pectoris: Secondary | ICD-10-CM | POA: Diagnosis not present

## 2020-11-15 DIAGNOSIS — R011 Cardiac murmur, unspecified: Secondary | ICD-10-CM | POA: Diagnosis not present

## 2020-11-15 DIAGNOSIS — I120 Hypertensive chronic kidney disease with stage 5 chronic kidney disease or end stage renal disease: Secondary | ICD-10-CM | POA: Diagnosis not present

## 2020-11-15 DIAGNOSIS — D631 Anemia in chronic kidney disease: Secondary | ICD-10-CM | POA: Diagnosis not present

## 2020-11-18 DIAGNOSIS — I120 Hypertensive chronic kidney disease with stage 5 chronic kidney disease or end stage renal disease: Secondary | ICD-10-CM | POA: Diagnosis not present

## 2020-11-18 DIAGNOSIS — D631 Anemia in chronic kidney disease: Secondary | ICD-10-CM | POA: Diagnosis not present

## 2020-11-18 DIAGNOSIS — E785 Hyperlipidemia, unspecified: Secondary | ICD-10-CM | POA: Diagnosis not present

## 2020-11-18 DIAGNOSIS — R011 Cardiac murmur, unspecified: Secondary | ICD-10-CM | POA: Diagnosis not present

## 2020-11-18 DIAGNOSIS — N186 End stage renal disease: Secondary | ICD-10-CM | POA: Diagnosis not present

## 2020-11-18 DIAGNOSIS — L309 Dermatitis, unspecified: Secondary | ICD-10-CM | POA: Diagnosis not present

## 2020-11-18 DIAGNOSIS — R634 Abnormal weight loss: Secondary | ICD-10-CM | POA: Diagnosis not present

## 2020-11-18 DIAGNOSIS — Z6820 Body mass index (BMI) 20.0-20.9, adult: Secondary | ICD-10-CM | POA: Diagnosis not present

## 2020-11-18 DIAGNOSIS — I2510 Atherosclerotic heart disease of native coronary artery without angina pectoris: Secondary | ICD-10-CM | POA: Diagnosis not present

## 2020-11-18 DIAGNOSIS — K219 Gastro-esophageal reflux disease without esophagitis: Secondary | ICD-10-CM | POA: Diagnosis not present

## 2020-11-18 DIAGNOSIS — Z8042 Family history of malignant neoplasm of prostate: Secondary | ICD-10-CM | POA: Diagnosis not present

## 2020-11-18 DIAGNOSIS — I252 Old myocardial infarction: Secondary | ICD-10-CM | POA: Diagnosis not present

## 2020-12-14 ENCOUNTER — Emergency Department (HOSPITAL_COMMUNITY)
Admission: EM | Admit: 2020-12-14 | Discharge: 2020-12-14 | Disposition: A | Discharge status: Home / Self Care | Attending: Emergency Medicine | Admitting: Emergency Medicine

## 2020-12-14 ENCOUNTER — Encounter (HOSPITAL_COMMUNITY): Payer: Self-pay

## 2020-12-14 ENCOUNTER — Emergency Department (HOSPITAL_COMMUNITY)

## 2020-12-14 ENCOUNTER — Other Ambulatory Visit: Payer: Self-pay

## 2020-12-14 DIAGNOSIS — S51812A Laceration without foreign body of left forearm, initial encounter: Secondary | ICD-10-CM | POA: Diagnosis not present

## 2020-12-14 DIAGNOSIS — S1181XA Laceration without foreign body of other specified part of neck, initial encounter: Secondary | ICD-10-CM | POA: Diagnosis not present

## 2020-12-14 DIAGNOSIS — T148XXA Other injury of unspecified body region, initial encounter: Secondary | ICD-10-CM

## 2020-12-14 DIAGNOSIS — I2510 Atherosclerotic heart disease of native coronary artery without angina pectoris: Secondary | ICD-10-CM | POA: Diagnosis not present

## 2020-12-14 DIAGNOSIS — I10 Essential (primary) hypertension: Secondary | ICD-10-CM | POA: Diagnosis not present

## 2020-12-14 DIAGNOSIS — S42201A Unspecified fracture of upper end of right humerus, initial encounter for closed fracture: Secondary | ICD-10-CM | POA: Insufficient documentation

## 2020-12-14 DIAGNOSIS — N186 End stage renal disease: Secondary | ICD-10-CM | POA: Diagnosis not present

## 2020-12-14 DIAGNOSIS — Z7982 Long term (current) use of aspirin: Secondary | ICD-10-CM | POA: Insufficient documentation

## 2020-12-14 DIAGNOSIS — M25519 Pain in unspecified shoulder: Secondary | ICD-10-CM | POA: Diagnosis not present

## 2020-12-14 DIAGNOSIS — W1839XA Other fall on same level, initial encounter: Secondary | ICD-10-CM | POA: Insufficient documentation

## 2020-12-14 DIAGNOSIS — Z79899 Other long term (current) drug therapy: Secondary | ICD-10-CM | POA: Diagnosis not present

## 2020-12-14 DIAGNOSIS — S61411A Laceration without foreign body of right hand, initial encounter: Secondary | ICD-10-CM | POA: Insufficient documentation

## 2020-12-14 DIAGNOSIS — Z23 Encounter for immunization: Secondary | ICD-10-CM | POA: Diagnosis not present

## 2020-12-14 DIAGNOSIS — S51811A Laceration without foreign body of right forearm, initial encounter: Secondary | ICD-10-CM | POA: Insufficient documentation

## 2020-12-14 DIAGNOSIS — Z951 Presence of aortocoronary bypass graft: Secondary | ICD-10-CM | POA: Insufficient documentation

## 2020-12-14 DIAGNOSIS — S4991XA Unspecified injury of right shoulder and upper arm, initial encounter: Secondary | ICD-10-CM | POA: Diagnosis present

## 2020-12-14 DIAGNOSIS — R55 Syncope and collapse: Secondary | ICD-10-CM | POA: Diagnosis not present

## 2020-12-14 DIAGNOSIS — W19XXXA Unspecified fall, initial encounter: Secondary | ICD-10-CM

## 2020-12-14 DIAGNOSIS — I120 Hypertensive chronic kidney disease with stage 5 chronic kidney disease or end stage renal disease: Secondary | ICD-10-CM | POA: Diagnosis not present

## 2020-12-14 DIAGNOSIS — S61412A Laceration without foreign body of left hand, initial encounter: Secondary | ICD-10-CM | POA: Diagnosis not present

## 2020-12-14 LAB — BASIC METABOLIC PANEL
Anion gap: 8 (ref 5–15)
BUN: 58 mg/dL — ABNORMAL HIGH (ref 8–23)
CO2: 24 mmol/L (ref 22–32)
Calcium: 9.2 mg/dL (ref 8.9–10.3)
Chloride: 102 mmol/L (ref 98–111)
Creatinine, Ser: 3.06 mg/dL — ABNORMAL HIGH (ref 0.61–1.24)
GFR, Estimated: 19 mL/min — ABNORMAL LOW (ref >60)
Glucose, Bld: 110 mg/dL — ABNORMAL HIGH (ref 70–99)
Potassium: 4.9 mmol/L (ref 3.5–5.1)
Sodium: 134 mmol/L — ABNORMAL LOW (ref 135–145)

## 2020-12-14 MED ORDER — TETANUS-DIPHTH-ACELL PERTUSSIS 5-2.5-18.5 LF-MCG/0.5 IM SUSY
0.5000 mL | PREFILLED_SYRINGE | Freq: Once | INTRAMUSCULAR | Status: AC
  Administered 2020-12-14: 0.5 mL via INTRAMUSCULAR
  Filled 2020-12-14: qty 0.5

## 2020-12-14 MED ORDER — SODIUM CHLORIDE 0.9 % IV BOLUS
500.0000 mL | Freq: Once | INTRAVENOUS | Status: AC
  Administered 2020-12-14: 500 mL via INTRAVENOUS

## 2020-12-14 MED ORDER — HYDROCODONE-ACETAMINOPHEN 5-325 MG PO TABS: 1.0000 | ORAL_TABLET | Freq: Four times a day (QID) | ORAL | 0 refills | Status: AC | PRN

## 2020-12-14 MED ORDER — FENTANYL CITRATE PF 50 MCG/ML IJ SOSY
50.0000 ug | PREFILLED_SYRINGE | Freq: Once | INTRAMUSCULAR | Status: AC
  Administered 2020-12-14: 50 ug via INTRAVENOUS
  Filled 2020-12-14: qty 1

## 2020-12-14 NOTE — Progress Notes (Signed)
Orthopedic Tech Progress Note Patient Details:  Trevor Cooper. 1931/05/15 [id number]  Ortho Devices Type of Ortho Device: Sling and swathe Ortho Device/Splint Location: right Ortho Device/Splint Interventions: Application   Post Interventions Patient Tolerated: Well Instructions Provided: Care of device  [place] Pink 12/14/2020, 10:10 AM

## 2020-12-14 NOTE — ED Notes (Signed)
Light green, dark green, and lavender tubes sent to lab

## 2020-12-14 NOTE — Progress Notes (Signed)
Manufacturing engineer [facility])   This patient is an active hospice pt with [facility], on service with a terminal diagnosis of ESRD.  [facility] will continue to follow for any discharge planning needs and to coordinate continuation of hospice care.    At time of discharge please use [facility] as we contract this service for our active hospice pts.  Thank you for the opportunity to participate in this patient's care.     Domenic Moras, BSN, RN [facility] Liaison [phone] (24h on call)

## 2020-12-14 NOTE — ED Provider Notes (Signed)
[place] DEPT Provider Note   CSN: [id number] Arrival date & time: 12/14/20  5427     History Chief Complaint  Patient presents with   Fall    Pt fell while trying to go to the bathroom this morning.  He said he does not remember the fall. Rt shoulder hurts, skin tears on rt elbow, rt wrist, left forearm, and left wrist    Trevor Timko. is a 87 y.o. male.  He has a history of end-stage renal disease not on dialysis and currently in hospice care.  He said that he was up earlier this morning going to the bathroom when he passed out.  He woke up with severe right shoulder pain.  He also has skin tears over his arms.  He denies any headache neck pain chest pain abdominal pain.  He has poor intake and has been losing weight.  Denies any focal numbness or weakness.  The history is provided by the patient and a relative.  Fall This is a new problem. The current episode started 3 to 5 hours ago. The problem has not changed since onset.Pertinent negatives include no chest pain, no abdominal pain, no headaches and no shortness of breath. The symptoms are aggravated by bending and twisting. Nothing relieves the symptoms. He has tried nothing for the symptoms. The treatment provided no relief.      Past Medical History:  Diagnosis Date   Aortic regurgitation    Chronic kidney disease    Coronary artery disease    GERD (gastroesophageal reflux disease)    Heart murmur    Hyperlipidemia    Hypertension    Myocardial infarction [facility])     Patient Active Problem List   Diagnosis Date Noted   Nonrheumatic aortic valve insufficiency 07/08/2020   CKD (chronic kidney disease), stage IV ([place]) 08/01/2019   Asymptomatic bilateral carotid artery stenosis 08/01/2019   Atherosclerosis of coronary artery bypass graft of native heart without angina pectoris 12/13/2018   Essential hypertension 12/13/2018   Bilateral carotid bruits 12/13/2018   Leg edema 12/13/2018    Myocardial infarction [facility])    Nonrheumatic aortic insufficiency with aortic stenosis    GERD (gastroesophageal reflux disease)    CKD (chronic kidney disease) stage 3, GFR 30-59 ml/min ([place]) 08/03/2018    Past Surgical History:  Procedure Laterality Date   CORONARY ARTERY BYPASS GRAFT  2001       Family History  Problem Relation Age of Onset   Heart failure Father     Social History   Tobacco Use   Smoking status: Never   Smokeless tobacco: Never  Vaping Use   Vaping Use: Never used  Substance Use Topics   Alcohol use: Yes    Comment: occasional   Drug use: Never    Home Medications Prior to Admission medications   Medication Sig Start Date End Date Taking? Authorizing Provider  Ferrous Sulfate (SLOW FE) 142 (45 Fe) MG TBCR Take 45 mg by mouth daily.   Yes [provider]  senna-docusate (SENOKOT-S) 8.6-50 MG tablet Take 2 tablets by mouth 2 (two) times daily.   Yes [provider]  amLODipine (NORVASC) 5 MG tablet TAKE 1 TABLET BY MOUTH EVERY DAY Patient taking differently: Take 5 mg by mouth daily. 11/03/20   Patwardhan, Reynold Bowen, MD  aspirin EC 81 MG tablet Take 81 mg by mouth daily.    [provider]  atorvastatin (LIPITOR) 80 MG tablet TAKE 1 TABLET BY MOUTH EVERYDAY  AT BEDTIME Patient taking differently: Take 80 mg by mouth at bedtime. 08/06/20   Patwardhan, Reynold Bowen, MD  bisacodyl (DULCOLAX) 5 MG EC tablet Take 5 mg by mouth daily.    [provider]  Cholecalciferol (VITAMIN D3) 25 MCG (1000 UT) CAPS Take 1,000 Units by mouth daily.    [provider]  ezetimibe (ZETIA) 10 MG tablet Take 1 tablet (10 mg total) by mouth daily. 12/04/18   Adrian Prows, MD  metoprolol succinate (TOPROL-XL) 50 MG 24 hr tablet Take 50 mg by mouth in the morning and at bedtime. Take with or immediately following a meal.    [provider]  mineral oil-hydrophilic petrolatum (AQUAPHOR) ointment Apply 1 application topically 2 (two)  times daily as needed for dry skin.     [provider]  Nutritional Supplements (JUICE PLUS FIBRE PO) Take 2 capsules by mouth 2 (two) times daily. (Garden)    [provider]  omeprazole (PRILOSEC) 20 MG capsule Take 20 mg by mouth daily. Taking before breakfast 07/11/18   [provider]  sorbitol 70 % SOLN Take 30 mLs by mouth See admin instructions. 41ml every 2 hours until bowel movement then 64ml every 6 hours as needed for constipation 11/08/20   [provider]  triamcinolone cream (KENALOG) 0.1 % Apply 1 application topically daily.    [provider]  vitamin B-12 (CYANOCOBALAMIN) 500 MCG tablet Take 500 mcg by mouth daily.    [provider]  vitamin C (ASCORBIC ACID) 250 MG tablet Take 250 mg by mouth daily.     [provider]  vitamin E 400 UNIT capsule Take 400 Units by mouth every evening.     [provider]    Allergies    Ciprofloxacin and Penicillins  Review of Systems   Review of Systems  Constitutional:  Negative for fever.  HENT:  Negative for sore throat.   Eyes:  Negative for visual disturbance.  Respiratory:  Negative for shortness of breath.   Cardiovascular:  Negative for chest pain.  Gastrointestinal:  Negative for abdominal pain.  Genitourinary:  Negative for dysuria.  Musculoskeletal:  Negative for neck pain.  Skin:  Positive for wound. Negative for rash.  Neurological:  Negative for headaches.   Physical Exam Updated Vital Signs BP (!) 181/58 (BP Location: Left Arm)   Pulse 70   Temp 97.7 F (36.5 C) (Oral)   Resp 20   Ht 5\' 9"  (1.753 m)   Wt 59 kg   SpO2 99%   BMI 19.20 kg/m   Physical Exam Vitals and nursing note reviewed.  Constitutional:      Appearance: Normal appearance. He is well-developed.  HENT:     Head: Normocephalic and atraumatic.  Eyes:     Conjunctiva/sclera: Conjunctivae normal.  Neck:     Comments: Has a small skin tear anterior neck.  No palpable  hematoma trach midline . patient in c-collar initially.  Patient's neck cleared.  No tenderness full range of motion Cardiovascular:     Rate and Rhythm: Normal rate and regular rhythm.     Heart sounds: No murmur heard. Pulmonary:     Effort: Pulmonary effort is normal. No respiratory distress.     Breath sounds: Normal breath sounds.  Abdominal:     Palpations: Abdomen is soft.     Tenderness: There is no abdominal tenderness. There is no guarding or rebound.  Musculoskeletal:        General: Tenderness and signs of  injury present. No deformity.     Cervical back: No tenderness.     Comments: Patient is tender over proximal right shoulder.  Elbow and wrist nontender.  Multiple skin tears on patient's hands and forearms bilaterally.  Full range of motion lower extremities without any pain or limitations.  Skin:    General: Skin is warm and dry.  Neurological:     General: No focal deficit present.     Mental Status: He is alert.     Sensory: No sensory deficit.     Motor: No weakness.    ED Results / Procedures / Treatments   Labs (all labs ordered are listed, but only abnormal results are displayed) Labs Reviewed  BASIC METABOLIC PANEL - Abnormal; Notable for the following components:      Result Value   Sodium 134 (*)    Glucose, Bld 110 (*)    BUN 58 (*)    Creatinine, Ser 3.06 (*)    GFR, Estimated 19 (*)    All other components within normal limits    EKG None  Radiology DG Shoulder Right  Result Date: 12/14/2020 CLINICAL DATA:  Right shoulder pain after a fall. EXAM: RIGHT SHOULDER - 2+ VIEW COMPARISON:  None. FINDINGS: There is a mildly comminuted fracture of the humeral neck with anterior displacement of the humeral shaft relative to the head fragment. Moderate acromioclavicular osteoarthrosis is noted. There is been prior CABG. IMPRESSION: Right humeral neck fracture. Electronically Signed   By: Logan Bores M.D.   On: 12/14/2020 08:42    Procedures Procedures    Medications Ordered in ED Medications  sodium chloride 0.9 % bolus 500 mL (has no administration in time range)  fentaNYL (SUBLIMAZE) injection 50 mcg (has no administration in time range)    ED Course  I have reviewed the triage vital signs and the nursing notes.  Pertinent labs & imaging results that were available during my care of the patient were reviewed by me and considered in my medical decision making (see chart for details).  Clinical Course as of 12/14/20 1725  Tue Dec 14, 2020  0748 Patient's daughter at bedside.  Patient is active in hospice care with end-stage renal disease.  Discussed with daughter regarding imaging and lab work.  She would rather just get a x-ray of the shoulder and give him some IV fluids and pain medicine.  She is declining getting a CT head cervical spine and lab work. [MB]  9924 Patient's x-ray showing proximal humerus fracture.  Due to his hospice status do not feel this is going to require orthopedic repair.  Will place in sling.  Nurse to do some wound care for his multiple skin tears.  I reached out to [facility] care and they will connect with daughter regarding any other services that they can provide. [MB]  0908 Patient's wounds were cleaned and dressed by nursing.  Shoulder immobilizer applied by orthopedic tech.  Daughter updated on lab work.  She is comfortable taking the patient home. [MB]    Clinical Course User Index [MB] Hayden Rasmussen, MD   MDM Rules/Calculators/A&P                          This patient complains of right shoulder pain after fall syncope; this involves an extensive number of treatment Options and is a complaint that carries with it a high risk of complications and Morbidity. The differential includes shoulder fracture, dislocation, metabolic derangement,  arrhythmia.  Due to the fact that patient is under hospice care did not aggressively pursue all causes of symptoms.  I ordered, reviewed and interpreted labs, which  included chemistry with elevated BUN and creatinine reportedly better than priors from daughters report of records I ordered medication IV pain medication and fluids I ordered imaging studies which included right shoulder x-ray and I independently    visualized and interpreted imaging which showed neck fracture Additional history obtained from patient's daughter Previous records obtained and reviewed in epic no recent admissions I consulted [facility] hospice and discussed lab and imaging findings  Critical Interventions: None  After the interventions stated above, I reevaluated the patient and found patient's pain to be adequately controlled.  Placed in sling.  Given orthopedic follow-up information.  Daughter wishes patient to return home which I think is reasonable in the setting of his hospice care.  Return instructions discussed   Final Clinical Impression(s) / ED Diagnoses Final diagnoses:  Fall, initial encounter  Closed fracture of proximal end of right humerus, unspecified fracture morphology, initial encounter  Multiple skin tears    Rx / DC Orders ED Discharge Orders          Ordered    HYDROcodone-acetaminophen (NORCO/VICODIN) 5-325 MG tablet  Every 6 hours PRN        12/14/20 1034             Hayden Rasmussen, MD 12/14/20 1727

## 2020-12-14 NOTE — ED Triage Notes (Signed)
Pt is alert and oriented x4, does not remember the fall itself, he thinks he passed out, he remembers having to go urinate and woke up on the floor. Rt arm is in a sling and all skintears are covered with 4x4's

## 2020-12-14 NOTE — ED Triage Notes (Signed)
Pt is under hospice care for end stage renal disease

## 2020-12-14 NOTE — ED Notes (Signed)
Removed bandages applied by EMS. Rinsed wounds with saline, applied wound spray, covered with non-stick bandages, and wrapped with curlex. 2 inch square skin tear on right lateral elbow. 1 inch square skin tear on right thumb. 1 inch skin tear on right first knuckle. 5 inch long skin tear on left wrist. 1 inch long skin tear on left thumb. 2 inch square on left medial forearm.

## 2020-12-14 NOTE — Discharge Instructions (Signed)
You were seen in the emergency department for evaluation of injuries from a fall.  An x-ray of your right shoulder showed a proximal humerus fracture.  This is usually treated conservatively with a sling.  You also had multiple skin tears on your arms and hands.  Please use soap and water in these areas and watch for signs of infection.  We are prescribing some narcotic pain medicine.  This may make you constipated dizzy or nauseous so please use caution.  Follow-up with your regular doctor.  Return to the emergency department if any worsening or concerning symptoms.

## 2020-12-18 DIAGNOSIS — Z8042 Family history of malignant neoplasm of prostate: Secondary | ICD-10-CM | POA: Diagnosis not present

## 2020-12-18 DIAGNOSIS — I120 Hypertensive chronic kidney disease with stage 5 chronic kidney disease or end stage renal disease: Secondary | ICD-10-CM | POA: Diagnosis not present

## 2020-12-18 DIAGNOSIS — R011 Cardiac murmur, unspecified: Secondary | ICD-10-CM | POA: Diagnosis not present

## 2020-12-18 DIAGNOSIS — D631 Anemia in chronic kidney disease: Secondary | ICD-10-CM | POA: Diagnosis not present

## 2020-12-18 DIAGNOSIS — Z6820 Body mass index (BMI) 20.0-20.9, adult: Secondary | ICD-10-CM | POA: Diagnosis not present

## 2020-12-18 DIAGNOSIS — I252 Old myocardial infarction: Secondary | ICD-10-CM | POA: Diagnosis not present

## 2020-12-18 DIAGNOSIS — L309 Dermatitis, unspecified: Secondary | ICD-10-CM | POA: Diagnosis not present

## 2020-12-18 DIAGNOSIS — N186 End stage renal disease: Secondary | ICD-10-CM | POA: Diagnosis not present

## 2020-12-18 DIAGNOSIS — K219 Gastro-esophageal reflux disease without esophagitis: Secondary | ICD-10-CM | POA: Diagnosis not present

## 2020-12-18 DIAGNOSIS — E785 Hyperlipidemia, unspecified: Secondary | ICD-10-CM | POA: Diagnosis not present

## 2020-12-18 DIAGNOSIS — R634 Abnormal weight loss: Secondary | ICD-10-CM | POA: Diagnosis not present

## 2020-12-18 DIAGNOSIS — I2510 Atherosclerotic heart disease of native coronary artery without angina pectoris: Secondary | ICD-10-CM | POA: Diagnosis not present

## 2020-12-20 DIAGNOSIS — D649 Anemia, unspecified: Secondary | ICD-10-CM | POA: Diagnosis not present

## 2020-12-20 DIAGNOSIS — E785 Hyperlipidemia, unspecified: Secondary | ICD-10-CM | POA: Diagnosis not present

## 2020-12-20 DIAGNOSIS — N186 End stage renal disease: Secondary | ICD-10-CM | POA: Diagnosis not present

## 2020-12-20 DIAGNOSIS — R5381 Other malaise: Secondary | ICD-10-CM | POA: Diagnosis not present

## 2020-12-20 DIAGNOSIS — R011 Cardiac murmur, unspecified: Secondary | ICD-10-CM | POA: Diagnosis not present

## 2020-12-20 DIAGNOSIS — I120 Hypertensive chronic kidney disease with stage 5 chronic kidney disease or end stage renal disease: Secondary | ICD-10-CM | POA: Diagnosis not present

## 2020-12-20 DIAGNOSIS — D631 Anemia in chronic kidney disease: Secondary | ICD-10-CM | POA: Diagnosis not present

## 2020-12-20 DIAGNOSIS — I2510 Atherosclerotic heart disease of native coronary artery without angina pectoris: Secondary | ICD-10-CM | POA: Diagnosis not present

## 2020-12-20 DIAGNOSIS — I10 Essential (primary) hypertension: Secondary | ICD-10-CM | POA: Diagnosis not present

## 2020-12-20 DIAGNOSIS — K219 Gastro-esophageal reflux disease without esophagitis: Secondary | ICD-10-CM | POA: Diagnosis not present

## 2020-12-20 DIAGNOSIS — K5900 Constipation, unspecified: Secondary | ICD-10-CM | POA: Diagnosis not present

## 2020-12-21 DIAGNOSIS — I120 Hypertensive chronic kidney disease with stage 5 chronic kidney disease or end stage renal disease: Secondary | ICD-10-CM | POA: Diagnosis not present

## 2020-12-21 DIAGNOSIS — I2510 Atherosclerotic heart disease of native coronary artery without angina pectoris: Secondary | ICD-10-CM | POA: Diagnosis not present

## 2020-12-21 DIAGNOSIS — E785 Hyperlipidemia, unspecified: Secondary | ICD-10-CM | POA: Diagnosis not present

## 2020-12-21 DIAGNOSIS — D631 Anemia in chronic kidney disease: Secondary | ICD-10-CM | POA: Diagnosis not present

## 2020-12-21 DIAGNOSIS — R011 Cardiac murmur, unspecified: Secondary | ICD-10-CM | POA: Diagnosis not present

## 2020-12-21 DIAGNOSIS — N186 End stage renal disease: Secondary | ICD-10-CM | POA: Diagnosis not present

## 2020-12-22 DIAGNOSIS — E785 Hyperlipidemia, unspecified: Secondary | ICD-10-CM | POA: Diagnosis not present

## 2020-12-22 DIAGNOSIS — N186 End stage renal disease: Secondary | ICD-10-CM | POA: Diagnosis not present

## 2020-12-22 DIAGNOSIS — D631 Anemia in chronic kidney disease: Secondary | ICD-10-CM | POA: Diagnosis not present

## 2020-12-22 DIAGNOSIS — I120 Hypertensive chronic kidney disease with stage 5 chronic kidney disease or end stage renal disease: Secondary | ICD-10-CM | POA: Diagnosis not present

## 2020-12-22 DIAGNOSIS — R011 Cardiac murmur, unspecified: Secondary | ICD-10-CM | POA: Diagnosis not present

## 2020-12-22 DIAGNOSIS — I2510 Atherosclerotic heart disease of native coronary artery without angina pectoris: Secondary | ICD-10-CM | POA: Diagnosis not present

## 2020-12-23 DIAGNOSIS — E785 Hyperlipidemia, unspecified: Secondary | ICD-10-CM | POA: Diagnosis not present

## 2020-12-23 DIAGNOSIS — K5900 Constipation, unspecified: Secondary | ICD-10-CM | POA: Diagnosis not present

## 2020-12-23 DIAGNOSIS — I2510 Atherosclerotic heart disease of native coronary artery without angina pectoris: Secondary | ICD-10-CM | POA: Diagnosis not present

## 2020-12-23 DIAGNOSIS — D649 Anemia, unspecified: Secondary | ICD-10-CM | POA: Diagnosis not present

## 2020-12-23 DIAGNOSIS — R5381 Other malaise: Secondary | ICD-10-CM | POA: Diagnosis not present

## 2020-12-23 DIAGNOSIS — I10 Essential (primary) hypertension: Secondary | ICD-10-CM | POA: Diagnosis not present

## 2020-12-23 DIAGNOSIS — R011 Cardiac murmur, unspecified: Secondary | ICD-10-CM | POA: Diagnosis not present

## 2020-12-23 DIAGNOSIS — I120 Hypertensive chronic kidney disease with stage 5 chronic kidney disease or end stage renal disease: Secondary | ICD-10-CM | POA: Diagnosis not present

## 2020-12-23 DIAGNOSIS — N186 End stage renal disease: Secondary | ICD-10-CM | POA: Diagnosis not present

## 2020-12-23 DIAGNOSIS — K219 Gastro-esophageal reflux disease without esophagitis: Secondary | ICD-10-CM | POA: Diagnosis not present

## 2020-12-23 DIAGNOSIS — D631 Anemia in chronic kidney disease: Secondary | ICD-10-CM | POA: Diagnosis not present

## 2020-12-23 DIAGNOSIS — S42301D Unspecified fracture of shaft of humerus, right arm, subsequent encounter for fracture with routine healing: Secondary | ICD-10-CM | POA: Diagnosis not present

## 2020-12-26 DIAGNOSIS — E785 Hyperlipidemia, unspecified: Secondary | ICD-10-CM | POA: Diagnosis not present

## 2020-12-26 DIAGNOSIS — I2510 Atherosclerotic heart disease of native coronary artery without angina pectoris: Secondary | ICD-10-CM | POA: Diagnosis not present

## 2020-12-26 DIAGNOSIS — I120 Hypertensive chronic kidney disease with stage 5 chronic kidney disease or end stage renal disease: Secondary | ICD-10-CM | POA: Diagnosis not present

## 2020-12-26 DIAGNOSIS — N186 End stage renal disease: Secondary | ICD-10-CM | POA: Diagnosis not present

## 2020-12-26 DIAGNOSIS — D631 Anemia in chronic kidney disease: Secondary | ICD-10-CM | POA: Diagnosis not present

## 2020-12-26 DIAGNOSIS — R011 Cardiac murmur, unspecified: Secondary | ICD-10-CM | POA: Diagnosis not present

## 2020-12-27 DIAGNOSIS — I2510 Atherosclerotic heart disease of native coronary artery without angina pectoris: Secondary | ICD-10-CM | POA: Diagnosis not present

## 2020-12-27 DIAGNOSIS — N186 End stage renal disease: Secondary | ICD-10-CM | POA: Diagnosis not present

## 2020-12-27 DIAGNOSIS — K219 Gastro-esophageal reflux disease without esophagitis: Secondary | ICD-10-CM | POA: Diagnosis not present

## 2020-12-27 DIAGNOSIS — D649 Anemia, unspecified: Secondary | ICD-10-CM | POA: Diagnosis not present

## 2020-12-27 DIAGNOSIS — K5900 Constipation, unspecified: Secondary | ICD-10-CM | POA: Diagnosis not present

## 2020-12-27 DIAGNOSIS — S42301D Unspecified fracture of shaft of humerus, right arm, subsequent encounter for fracture with routine healing: Secondary | ICD-10-CM | POA: Diagnosis not present

## 2020-12-27 DIAGNOSIS — I10 Essential (primary) hypertension: Secondary | ICD-10-CM | POA: Diagnosis not present

## 2020-12-27 DIAGNOSIS — R5381 Other malaise: Secondary | ICD-10-CM | POA: Diagnosis not present

## 2020-12-28 DIAGNOSIS — R011 Cardiac murmur, unspecified: Secondary | ICD-10-CM | POA: Diagnosis not present

## 2020-12-28 DIAGNOSIS — E785 Hyperlipidemia, unspecified: Secondary | ICD-10-CM | POA: Diagnosis not present

## 2020-12-28 DIAGNOSIS — I2510 Atherosclerotic heart disease of native coronary artery without angina pectoris: Secondary | ICD-10-CM | POA: Diagnosis not present

## 2020-12-28 DIAGNOSIS — I120 Hypertensive chronic kidney disease with stage 5 chronic kidney disease or end stage renal disease: Secondary | ICD-10-CM | POA: Diagnosis not present

## 2020-12-28 DIAGNOSIS — N186 End stage renal disease: Secondary | ICD-10-CM | POA: Diagnosis not present

## 2020-12-28 DIAGNOSIS — D631 Anemia in chronic kidney disease: Secondary | ICD-10-CM | POA: Diagnosis not present

## 2020-12-29 DIAGNOSIS — I2510 Atherosclerotic heart disease of native coronary artery without angina pectoris: Secondary | ICD-10-CM | POA: Diagnosis not present

## 2020-12-29 DIAGNOSIS — K5900 Constipation, unspecified: Secondary | ICD-10-CM | POA: Diagnosis not present

## 2020-12-29 DIAGNOSIS — K219 Gastro-esophageal reflux disease without esophagitis: Secondary | ICD-10-CM | POA: Diagnosis not present

## 2020-12-29 DIAGNOSIS — I10 Essential (primary) hypertension: Secondary | ICD-10-CM | POA: Diagnosis not present

## 2020-12-29 DIAGNOSIS — N186 End stage renal disease: Secondary | ICD-10-CM | POA: Diagnosis not present

## 2020-12-29 DIAGNOSIS — H6123 Impacted cerumen, bilateral: Secondary | ICD-10-CM | POA: Diagnosis not present

## 2020-12-29 DIAGNOSIS — R5381 Other malaise: Secondary | ICD-10-CM | POA: Diagnosis not present

## 2020-12-29 DIAGNOSIS — D649 Anemia, unspecified: Secondary | ICD-10-CM | POA: Diagnosis not present

## 2020-12-30 DIAGNOSIS — D631 Anemia in chronic kidney disease: Secondary | ICD-10-CM | POA: Diagnosis not present

## 2020-12-30 DIAGNOSIS — I120 Hypertensive chronic kidney disease with stage 5 chronic kidney disease or end stage renal disease: Secondary | ICD-10-CM | POA: Diagnosis not present

## 2020-12-30 DIAGNOSIS — N186 End stage renal disease: Secondary | ICD-10-CM | POA: Diagnosis not present

## 2020-12-30 DIAGNOSIS — E785 Hyperlipidemia, unspecified: Secondary | ICD-10-CM | POA: Diagnosis not present

## 2020-12-30 DIAGNOSIS — I2510 Atherosclerotic heart disease of native coronary artery without angina pectoris: Secondary | ICD-10-CM | POA: Diagnosis not present

## 2020-12-30 DIAGNOSIS — R011 Cardiac murmur, unspecified: Secondary | ICD-10-CM | POA: Diagnosis not present

## 2021-01-02 DIAGNOSIS — E785 Hyperlipidemia, unspecified: Secondary | ICD-10-CM | POA: Diagnosis not present

## 2021-01-02 DIAGNOSIS — N186 End stage renal disease: Secondary | ICD-10-CM | POA: Diagnosis not present

## 2021-01-02 DIAGNOSIS — I120 Hypertensive chronic kidney disease with stage 5 chronic kidney disease or end stage renal disease: Secondary | ICD-10-CM | POA: Diagnosis not present

## 2021-01-02 DIAGNOSIS — I2510 Atherosclerotic heart disease of native coronary artery without angina pectoris: Secondary | ICD-10-CM | POA: Diagnosis not present

## 2021-01-02 DIAGNOSIS — R011 Cardiac murmur, unspecified: Secondary | ICD-10-CM | POA: Diagnosis not present

## 2021-01-02 DIAGNOSIS — D631 Anemia in chronic kidney disease: Secondary | ICD-10-CM | POA: Diagnosis not present

## 2021-01-04 DIAGNOSIS — E785 Hyperlipidemia, unspecified: Secondary | ICD-10-CM | POA: Diagnosis not present

## 2021-01-04 DIAGNOSIS — R011 Cardiac murmur, unspecified: Secondary | ICD-10-CM | POA: Diagnosis not present

## 2021-01-04 DIAGNOSIS — D631 Anemia in chronic kidney disease: Secondary | ICD-10-CM | POA: Diagnosis not present

## 2021-01-04 DIAGNOSIS — I2510 Atherosclerotic heart disease of native coronary artery without angina pectoris: Secondary | ICD-10-CM | POA: Diagnosis not present

## 2021-01-04 DIAGNOSIS — I120 Hypertensive chronic kidney disease with stage 5 chronic kidney disease or end stage renal disease: Secondary | ICD-10-CM | POA: Diagnosis not present

## 2021-01-04 DIAGNOSIS — N186 End stage renal disease: Secondary | ICD-10-CM | POA: Diagnosis not present

## 2021-01-05 DIAGNOSIS — K219 Gastro-esophageal reflux disease without esophagitis: Secondary | ICD-10-CM | POA: Diagnosis not present

## 2021-01-05 DIAGNOSIS — R5381 Other malaise: Secondary | ICD-10-CM | POA: Diagnosis not present

## 2021-01-05 DIAGNOSIS — I10 Essential (primary) hypertension: Secondary | ICD-10-CM | POA: Diagnosis not present

## 2021-01-05 DIAGNOSIS — I2510 Atherosclerotic heart disease of native coronary artery without angina pectoris: Secondary | ICD-10-CM | POA: Diagnosis not present

## 2021-01-05 DIAGNOSIS — N186 End stage renal disease: Secondary | ICD-10-CM | POA: Diagnosis not present

## 2021-01-05 DIAGNOSIS — H6123 Impacted cerumen, bilateral: Secondary | ICD-10-CM | POA: Diagnosis not present

## 2021-01-05 DIAGNOSIS — K5900 Constipation, unspecified: Secondary | ICD-10-CM | POA: Diagnosis not present

## 2021-01-05 DIAGNOSIS — D649 Anemia, unspecified: Secondary | ICD-10-CM | POA: Diagnosis not present

## 2021-01-06 DIAGNOSIS — R011 Cardiac murmur, unspecified: Secondary | ICD-10-CM | POA: Diagnosis not present

## 2021-01-06 DIAGNOSIS — D631 Anemia in chronic kidney disease: Secondary | ICD-10-CM | POA: Diagnosis not present

## 2021-01-06 DIAGNOSIS — I120 Hypertensive chronic kidney disease with stage 5 chronic kidney disease or end stage renal disease: Secondary | ICD-10-CM | POA: Diagnosis not present

## 2021-01-06 DIAGNOSIS — N186 End stage renal disease: Secondary | ICD-10-CM | POA: Diagnosis not present

## 2021-01-06 DIAGNOSIS — E785 Hyperlipidemia, unspecified: Secondary | ICD-10-CM | POA: Diagnosis not present

## 2021-01-06 DIAGNOSIS — I2510 Atherosclerotic heart disease of native coronary artery without angina pectoris: Secondary | ICD-10-CM | POA: Diagnosis not present

## 2021-01-07 DIAGNOSIS — S42201A Unspecified fracture of upper end of right humerus, initial encounter for closed fracture: Secondary | ICD-10-CM | POA: Diagnosis not present

## 2021-01-10 DIAGNOSIS — E785 Hyperlipidemia, unspecified: Secondary | ICD-10-CM | POA: Diagnosis not present

## 2021-01-10 DIAGNOSIS — D631 Anemia in chronic kidney disease: Secondary | ICD-10-CM | POA: Diagnosis not present

## 2021-01-10 DIAGNOSIS — I120 Hypertensive chronic kidney disease with stage 5 chronic kidney disease or end stage renal disease: Secondary | ICD-10-CM | POA: Diagnosis not present

## 2021-01-10 DIAGNOSIS — N186 End stage renal disease: Secondary | ICD-10-CM | POA: Diagnosis not present

## 2021-01-10 DIAGNOSIS — R011 Cardiac murmur, unspecified: Secondary | ICD-10-CM | POA: Diagnosis not present

## 2021-01-10 DIAGNOSIS — I2510 Atherosclerotic heart disease of native coronary artery without angina pectoris: Secondary | ICD-10-CM | POA: Diagnosis not present

## 2021-01-11 DIAGNOSIS — R011 Cardiac murmur, unspecified: Secondary | ICD-10-CM | POA: Diagnosis not present

## 2021-01-11 DIAGNOSIS — D631 Anemia in chronic kidney disease: Secondary | ICD-10-CM | POA: Diagnosis not present

## 2021-01-11 DIAGNOSIS — E785 Hyperlipidemia, unspecified: Secondary | ICD-10-CM | POA: Diagnosis not present

## 2021-01-11 DIAGNOSIS — I120 Hypertensive chronic kidney disease with stage 5 chronic kidney disease or end stage renal disease: Secondary | ICD-10-CM | POA: Diagnosis not present

## 2021-01-11 DIAGNOSIS — N186 End stage renal disease: Secondary | ICD-10-CM | POA: Diagnosis not present

## 2021-01-11 DIAGNOSIS — I2510 Atherosclerotic heart disease of native coronary artery without angina pectoris: Secondary | ICD-10-CM | POA: Diagnosis not present

## 2021-01-12 DIAGNOSIS — N186 End stage renal disease: Secondary | ICD-10-CM | POA: Diagnosis not present

## 2021-01-12 DIAGNOSIS — R5381 Other malaise: Secondary | ICD-10-CM | POA: Diagnosis not present

## 2021-01-12 DIAGNOSIS — E785 Hyperlipidemia, unspecified: Secondary | ICD-10-CM | POA: Diagnosis not present

## 2021-01-12 DIAGNOSIS — I10 Essential (primary) hypertension: Secondary | ICD-10-CM | POA: Diagnosis not present

## 2021-01-12 DIAGNOSIS — R001 Bradycardia, unspecified: Secondary | ICD-10-CM | POA: Diagnosis not present

## 2021-01-12 DIAGNOSIS — H6123 Impacted cerumen, bilateral: Secondary | ICD-10-CM | POA: Diagnosis not present

## 2021-01-12 DIAGNOSIS — D631 Anemia in chronic kidney disease: Secondary | ICD-10-CM | POA: Diagnosis not present

## 2021-01-12 DIAGNOSIS — R011 Cardiac murmur, unspecified: Secondary | ICD-10-CM | POA: Diagnosis not present

## 2021-01-12 DIAGNOSIS — I120 Hypertensive chronic kidney disease with stage 5 chronic kidney disease or end stage renal disease: Secondary | ICD-10-CM | POA: Diagnosis not present

## 2021-01-12 DIAGNOSIS — I2510 Atherosclerotic heart disease of native coronary artery without angina pectoris: Secondary | ICD-10-CM | POA: Diagnosis not present

## 2021-01-13 DIAGNOSIS — E785 Hyperlipidemia, unspecified: Secondary | ICD-10-CM | POA: Diagnosis not present

## 2021-01-13 DIAGNOSIS — D631 Anemia in chronic kidney disease: Secondary | ICD-10-CM | POA: Diagnosis not present

## 2021-01-13 DIAGNOSIS — I120 Hypertensive chronic kidney disease with stage 5 chronic kidney disease or end stage renal disease: Secondary | ICD-10-CM | POA: Diagnosis not present

## 2021-01-13 DIAGNOSIS — N186 End stage renal disease: Secondary | ICD-10-CM | POA: Diagnosis not present

## 2021-01-13 DIAGNOSIS — I2510 Atherosclerotic heart disease of native coronary artery without angina pectoris: Secondary | ICD-10-CM | POA: Diagnosis not present

## 2021-01-13 DIAGNOSIS — R011 Cardiac murmur, unspecified: Secondary | ICD-10-CM | POA: Diagnosis not present

## 2021-01-16 DIAGNOSIS — R011 Cardiac murmur, unspecified: Secondary | ICD-10-CM | POA: Diagnosis not present

## 2021-01-16 DIAGNOSIS — I2510 Atherosclerotic heart disease of native coronary artery without angina pectoris: Secondary | ICD-10-CM | POA: Diagnosis not present

## 2021-01-16 DIAGNOSIS — D631 Anemia in chronic kidney disease: Secondary | ICD-10-CM | POA: Diagnosis not present

## 2021-01-16 DIAGNOSIS — E785 Hyperlipidemia, unspecified: Secondary | ICD-10-CM | POA: Diagnosis not present

## 2021-01-16 DIAGNOSIS — N186 End stage renal disease: Secondary | ICD-10-CM | POA: Diagnosis not present

## 2021-01-16 DIAGNOSIS — I120 Hypertensive chronic kidney disease with stage 5 chronic kidney disease or end stage renal disease: Secondary | ICD-10-CM | POA: Diagnosis not present

## 2021-01-18 DIAGNOSIS — Z9181 History of falling: Secondary | ICD-10-CM | POA: Diagnosis not present

## 2021-01-18 DIAGNOSIS — R011 Cardiac murmur, unspecified: Secondary | ICD-10-CM | POA: Diagnosis not present

## 2021-01-18 DIAGNOSIS — E785 Hyperlipidemia, unspecified: Secondary | ICD-10-CM | POA: Diagnosis not present

## 2021-01-18 DIAGNOSIS — I2510 Atherosclerotic heart disease of native coronary artery without angina pectoris: Secondary | ICD-10-CM | POA: Diagnosis not present

## 2021-01-18 DIAGNOSIS — R634 Abnormal weight loss: Secondary | ICD-10-CM | POA: Diagnosis not present

## 2021-01-18 DIAGNOSIS — Z8042 Family history of malignant neoplasm of prostate: Secondary | ICD-10-CM | POA: Diagnosis not present

## 2021-01-18 DIAGNOSIS — N186 End stage renal disease: Secondary | ICD-10-CM | POA: Diagnosis not present

## 2021-01-18 DIAGNOSIS — K219 Gastro-esophageal reflux disease without esophagitis: Secondary | ICD-10-CM | POA: Diagnosis not present

## 2021-01-18 DIAGNOSIS — S42301D Unspecified fracture of shaft of humerus, right arm, subsequent encounter for fracture with routine healing: Secondary | ICD-10-CM | POA: Diagnosis not present

## 2021-01-18 DIAGNOSIS — L309 Dermatitis, unspecified: Secondary | ICD-10-CM | POA: Diagnosis not present

## 2021-01-18 DIAGNOSIS — Z6820 Body mass index (BMI) 20.0-20.9, adult: Secondary | ICD-10-CM | POA: Diagnosis not present

## 2021-01-18 DIAGNOSIS — D631 Anemia in chronic kidney disease: Secondary | ICD-10-CM | POA: Diagnosis not present

## 2021-01-18 DIAGNOSIS — I120 Hypertensive chronic kidney disease with stage 5 chronic kidney disease or end stage renal disease: Secondary | ICD-10-CM | POA: Diagnosis not present

## 2021-01-18 DIAGNOSIS — I252 Old myocardial infarction: Secondary | ICD-10-CM | POA: Diagnosis not present

## 2021-01-19 DIAGNOSIS — K5900 Constipation, unspecified: Secondary | ICD-10-CM | POA: Diagnosis not present

## 2021-01-19 DIAGNOSIS — N186 End stage renal disease: Secondary | ICD-10-CM | POA: Diagnosis not present

## 2021-01-19 DIAGNOSIS — I120 Hypertensive chronic kidney disease with stage 5 chronic kidney disease or end stage renal disease: Secondary | ICD-10-CM | POA: Diagnosis not present

## 2021-01-19 DIAGNOSIS — I2510 Atherosclerotic heart disease of native coronary artery without angina pectoris: Secondary | ICD-10-CM | POA: Diagnosis not present

## 2021-01-19 DIAGNOSIS — R4189 Other symptoms and signs involving cognitive functions and awareness: Secondary | ICD-10-CM | POA: Diagnosis not present

## 2021-01-19 DIAGNOSIS — D631 Anemia in chronic kidney disease: Secondary | ICD-10-CM | POA: Diagnosis not present

## 2021-01-19 DIAGNOSIS — D649 Anemia, unspecified: Secondary | ICD-10-CM | POA: Diagnosis not present

## 2021-01-19 DIAGNOSIS — R001 Bradycardia, unspecified: Secondary | ICD-10-CM | POA: Diagnosis not present

## 2021-01-19 DIAGNOSIS — E785 Hyperlipidemia, unspecified: Secondary | ICD-10-CM | POA: Diagnosis not present

## 2021-01-19 DIAGNOSIS — K219 Gastro-esophageal reflux disease without esophagitis: Secondary | ICD-10-CM | POA: Diagnosis not present

## 2021-01-19 DIAGNOSIS — R011 Cardiac murmur, unspecified: Secondary | ICD-10-CM | POA: Diagnosis not present

## 2021-01-19 DIAGNOSIS — R5381 Other malaise: Secondary | ICD-10-CM | POA: Diagnosis not present

## 2021-01-20 DIAGNOSIS — K5900 Constipation, unspecified: Secondary | ICD-10-CM | POA: Diagnosis not present

## 2021-01-20 DIAGNOSIS — R011 Cardiac murmur, unspecified: Secondary | ICD-10-CM | POA: Diagnosis not present

## 2021-01-20 DIAGNOSIS — I120 Hypertensive chronic kidney disease with stage 5 chronic kidney disease or end stage renal disease: Secondary | ICD-10-CM | POA: Diagnosis not present

## 2021-01-20 DIAGNOSIS — D649 Anemia, unspecified: Secondary | ICD-10-CM | POA: Diagnosis not present

## 2021-01-20 DIAGNOSIS — I10 Essential (primary) hypertension: Secondary | ICD-10-CM | POA: Diagnosis not present

## 2021-01-20 DIAGNOSIS — K219 Gastro-esophageal reflux disease without esophagitis: Secondary | ICD-10-CM | POA: Diagnosis not present

## 2021-01-20 DIAGNOSIS — D631 Anemia in chronic kidney disease: Secondary | ICD-10-CM | POA: Diagnosis not present

## 2021-01-20 DIAGNOSIS — I2510 Atherosclerotic heart disease of native coronary artery without angina pectoris: Secondary | ICD-10-CM | POA: Diagnosis not present

## 2021-01-20 DIAGNOSIS — N186 End stage renal disease: Secondary | ICD-10-CM | POA: Diagnosis not present

## 2021-01-20 DIAGNOSIS — R5381 Other malaise: Secondary | ICD-10-CM | POA: Diagnosis not present

## 2021-01-20 DIAGNOSIS — R001 Bradycardia, unspecified: Secondary | ICD-10-CM | POA: Diagnosis not present

## 2021-01-20 DIAGNOSIS — E785 Hyperlipidemia, unspecified: Secondary | ICD-10-CM | POA: Diagnosis not present

## 2021-01-23 DIAGNOSIS — I120 Hypertensive chronic kidney disease with stage 5 chronic kidney disease or end stage renal disease: Secondary | ICD-10-CM | POA: Diagnosis not present

## 2021-01-23 DIAGNOSIS — D631 Anemia in chronic kidney disease: Secondary | ICD-10-CM | POA: Diagnosis not present

## 2021-01-23 DIAGNOSIS — E785 Hyperlipidemia, unspecified: Secondary | ICD-10-CM | POA: Diagnosis not present

## 2021-01-23 DIAGNOSIS — R011 Cardiac murmur, unspecified: Secondary | ICD-10-CM | POA: Diagnosis not present

## 2021-01-23 DIAGNOSIS — N186 End stage renal disease: Secondary | ICD-10-CM | POA: Diagnosis not present

## 2021-01-23 DIAGNOSIS — I2510 Atherosclerotic heart disease of native coronary artery without angina pectoris: Secondary | ICD-10-CM | POA: Diagnosis not present

## 2021-01-25 DIAGNOSIS — D631 Anemia in chronic kidney disease: Secondary | ICD-10-CM | POA: Diagnosis not present

## 2021-01-25 DIAGNOSIS — R011 Cardiac murmur, unspecified: Secondary | ICD-10-CM | POA: Diagnosis not present

## 2021-01-25 DIAGNOSIS — E785 Hyperlipidemia, unspecified: Secondary | ICD-10-CM | POA: Diagnosis not present

## 2021-01-25 DIAGNOSIS — I120 Hypertensive chronic kidney disease with stage 5 chronic kidney disease or end stage renal disease: Secondary | ICD-10-CM | POA: Diagnosis not present

## 2021-01-25 DIAGNOSIS — I2510 Atherosclerotic heart disease of native coronary artery without angina pectoris: Secondary | ICD-10-CM | POA: Diagnosis not present

## 2021-01-25 DIAGNOSIS — N186 End stage renal disease: Secondary | ICD-10-CM | POA: Diagnosis not present

## 2021-01-27 DIAGNOSIS — D631 Anemia in chronic kidney disease: Secondary | ICD-10-CM | POA: Diagnosis not present

## 2021-01-27 DIAGNOSIS — I120 Hypertensive chronic kidney disease with stage 5 chronic kidney disease or end stage renal disease: Secondary | ICD-10-CM | POA: Diagnosis not present

## 2021-01-27 DIAGNOSIS — I2510 Atherosclerotic heart disease of native coronary artery without angina pectoris: Secondary | ICD-10-CM | POA: Diagnosis not present

## 2021-01-27 DIAGNOSIS — R011 Cardiac murmur, unspecified: Secondary | ICD-10-CM | POA: Diagnosis not present

## 2021-01-27 DIAGNOSIS — S42201S Unspecified fracture of upper end of right humerus, sequela: Secondary | ICD-10-CM | POA: Diagnosis not present

## 2021-01-27 DIAGNOSIS — E785 Hyperlipidemia, unspecified: Secondary | ICD-10-CM | POA: Diagnosis not present

## 2021-01-27 DIAGNOSIS — N186 End stage renal disease: Secondary | ICD-10-CM | POA: Diagnosis not present

## 2021-01-28 DIAGNOSIS — N186 End stage renal disease: Secondary | ICD-10-CM | POA: Diagnosis not present

## 2021-01-28 DIAGNOSIS — D631 Anemia in chronic kidney disease: Secondary | ICD-10-CM | POA: Diagnosis not present

## 2021-01-28 DIAGNOSIS — R011 Cardiac murmur, unspecified: Secondary | ICD-10-CM | POA: Diagnosis not present

## 2021-01-28 DIAGNOSIS — E785 Hyperlipidemia, unspecified: Secondary | ICD-10-CM | POA: Diagnosis not present

## 2021-01-28 DIAGNOSIS — I120 Hypertensive chronic kidney disease with stage 5 chronic kidney disease or end stage renal disease: Secondary | ICD-10-CM | POA: Diagnosis not present

## 2021-01-28 DIAGNOSIS — I2510 Atherosclerotic heart disease of native coronary artery without angina pectoris: Secondary | ICD-10-CM | POA: Diagnosis not present

## 2021-01-31 DIAGNOSIS — I2510 Atherosclerotic heart disease of native coronary artery without angina pectoris: Secondary | ICD-10-CM | POA: Diagnosis not present

## 2021-01-31 DIAGNOSIS — N186 End stage renal disease: Secondary | ICD-10-CM | POA: Diagnosis not present

## 2021-01-31 DIAGNOSIS — E785 Hyperlipidemia, unspecified: Secondary | ICD-10-CM | POA: Diagnosis not present

## 2021-01-31 DIAGNOSIS — D631 Anemia in chronic kidney disease: Secondary | ICD-10-CM | POA: Diagnosis not present

## 2021-01-31 DIAGNOSIS — R011 Cardiac murmur, unspecified: Secondary | ICD-10-CM | POA: Diagnosis not present

## 2021-01-31 DIAGNOSIS — I120 Hypertensive chronic kidney disease with stage 5 chronic kidney disease or end stage renal disease: Secondary | ICD-10-CM | POA: Diagnosis not present

## 2021-02-01 DIAGNOSIS — I2510 Atherosclerotic heart disease of native coronary artery without angina pectoris: Secondary | ICD-10-CM | POA: Diagnosis not present

## 2021-02-01 DIAGNOSIS — E785 Hyperlipidemia, unspecified: Secondary | ICD-10-CM | POA: Diagnosis not present

## 2021-02-01 DIAGNOSIS — I120 Hypertensive chronic kidney disease with stage 5 chronic kidney disease or end stage renal disease: Secondary | ICD-10-CM | POA: Diagnosis not present

## 2021-02-01 DIAGNOSIS — N186 End stage renal disease: Secondary | ICD-10-CM | POA: Diagnosis not present

## 2021-02-01 DIAGNOSIS — D631 Anemia in chronic kidney disease: Secondary | ICD-10-CM | POA: Diagnosis not present

## 2021-02-01 DIAGNOSIS — R011 Cardiac murmur, unspecified: Secondary | ICD-10-CM | POA: Diagnosis not present

## 2021-02-03 DIAGNOSIS — N186 End stage renal disease: Secondary | ICD-10-CM | POA: Diagnosis not present

## 2021-02-03 DIAGNOSIS — I2510 Atherosclerotic heart disease of native coronary artery without angina pectoris: Secondary | ICD-10-CM | POA: Diagnosis not present

## 2021-02-03 DIAGNOSIS — D631 Anemia in chronic kidney disease: Secondary | ICD-10-CM | POA: Diagnosis not present

## 2021-02-03 DIAGNOSIS — R011 Cardiac murmur, unspecified: Secondary | ICD-10-CM | POA: Diagnosis not present

## 2021-02-03 DIAGNOSIS — E785 Hyperlipidemia, unspecified: Secondary | ICD-10-CM | POA: Diagnosis not present

## 2021-02-03 DIAGNOSIS — I120 Hypertensive chronic kidney disease with stage 5 chronic kidney disease or end stage renal disease: Secondary | ICD-10-CM | POA: Diagnosis not present

## 2021-02-06 DIAGNOSIS — N186 End stage renal disease: Secondary | ICD-10-CM | POA: Diagnosis not present

## 2021-02-06 DIAGNOSIS — D631 Anemia in chronic kidney disease: Secondary | ICD-10-CM | POA: Diagnosis not present

## 2021-02-06 DIAGNOSIS — I2510 Atherosclerotic heart disease of native coronary artery without angina pectoris: Secondary | ICD-10-CM | POA: Diagnosis not present

## 2021-02-06 DIAGNOSIS — I120 Hypertensive chronic kidney disease with stage 5 chronic kidney disease or end stage renal disease: Secondary | ICD-10-CM | POA: Diagnosis not present

## 2021-02-06 DIAGNOSIS — R011 Cardiac murmur, unspecified: Secondary | ICD-10-CM | POA: Diagnosis not present

## 2021-02-06 DIAGNOSIS — E785 Hyperlipidemia, unspecified: Secondary | ICD-10-CM | POA: Diagnosis not present

## 2021-02-08 DIAGNOSIS — I2510 Atherosclerotic heart disease of native coronary artery without angina pectoris: Secondary | ICD-10-CM | POA: Diagnosis not present

## 2021-02-08 DIAGNOSIS — R011 Cardiac murmur, unspecified: Secondary | ICD-10-CM | POA: Diagnosis not present

## 2021-02-08 DIAGNOSIS — I120 Hypertensive chronic kidney disease with stage 5 chronic kidney disease or end stage renal disease: Secondary | ICD-10-CM | POA: Diagnosis not present

## 2021-02-08 DIAGNOSIS — E785 Hyperlipidemia, unspecified: Secondary | ICD-10-CM | POA: Diagnosis not present

## 2021-02-08 DIAGNOSIS — N186 End stage renal disease: Secondary | ICD-10-CM | POA: Diagnosis not present

## 2021-02-08 DIAGNOSIS — D631 Anemia in chronic kidney disease: Secondary | ICD-10-CM | POA: Diagnosis not present

## 2021-02-09 DIAGNOSIS — E785 Hyperlipidemia, unspecified: Secondary | ICD-10-CM | POA: Diagnosis not present

## 2021-02-09 DIAGNOSIS — R011 Cardiac murmur, unspecified: Secondary | ICD-10-CM | POA: Diagnosis not present

## 2021-02-09 DIAGNOSIS — I120 Hypertensive chronic kidney disease with stage 5 chronic kidney disease or end stage renal disease: Secondary | ICD-10-CM | POA: Diagnosis not present

## 2021-02-09 DIAGNOSIS — N186 End stage renal disease: Secondary | ICD-10-CM | POA: Diagnosis not present

## 2021-02-09 DIAGNOSIS — I2510 Atherosclerotic heart disease of native coronary artery without angina pectoris: Secondary | ICD-10-CM | POA: Diagnosis not present

## 2021-02-09 DIAGNOSIS — D631 Anemia in chronic kidney disease: Secondary | ICD-10-CM | POA: Diagnosis not present

## 2021-02-10 DIAGNOSIS — I120 Hypertensive chronic kidney disease with stage 5 chronic kidney disease or end stage renal disease: Secondary | ICD-10-CM | POA: Diagnosis not present

## 2021-02-10 DIAGNOSIS — E785 Hyperlipidemia, unspecified: Secondary | ICD-10-CM | POA: Diagnosis not present

## 2021-02-10 DIAGNOSIS — D631 Anemia in chronic kidney disease: Secondary | ICD-10-CM | POA: Diagnosis not present

## 2021-02-10 DIAGNOSIS — I2510 Atherosclerotic heart disease of native coronary artery without angina pectoris: Secondary | ICD-10-CM | POA: Diagnosis not present

## 2021-02-10 DIAGNOSIS — R011 Cardiac murmur, unspecified: Secondary | ICD-10-CM | POA: Diagnosis not present

## 2021-02-10 DIAGNOSIS — N186 End stage renal disease: Secondary | ICD-10-CM | POA: Diagnosis not present

## 2021-02-13 DIAGNOSIS — N186 End stage renal disease: Secondary | ICD-10-CM | POA: Diagnosis not present

## 2021-02-13 DIAGNOSIS — I120 Hypertensive chronic kidney disease with stage 5 chronic kidney disease or end stage renal disease: Secondary | ICD-10-CM | POA: Diagnosis not present

## 2021-02-13 DIAGNOSIS — I2510 Atherosclerotic heart disease of native coronary artery without angina pectoris: Secondary | ICD-10-CM | POA: Diagnosis not present

## 2021-02-13 DIAGNOSIS — E785 Hyperlipidemia, unspecified: Secondary | ICD-10-CM | POA: Diagnosis not present

## 2021-02-13 DIAGNOSIS — R011 Cardiac murmur, unspecified: Secondary | ICD-10-CM | POA: Diagnosis not present

## 2021-02-13 DIAGNOSIS — D631 Anemia in chronic kidney disease: Secondary | ICD-10-CM | POA: Diagnosis not present

## 2021-02-15 DIAGNOSIS — I120 Hypertensive chronic kidney disease with stage 5 chronic kidney disease or end stage renal disease: Secondary | ICD-10-CM | POA: Diagnosis not present

## 2021-02-15 DIAGNOSIS — N186 End stage renal disease: Secondary | ICD-10-CM | POA: Diagnosis not present

## 2021-02-15 DIAGNOSIS — R011 Cardiac murmur, unspecified: Secondary | ICD-10-CM | POA: Diagnosis not present

## 2021-02-15 DIAGNOSIS — D631 Anemia in chronic kidney disease: Secondary | ICD-10-CM | POA: Diagnosis not present

## 2021-02-15 DIAGNOSIS — E785 Hyperlipidemia, unspecified: Secondary | ICD-10-CM | POA: Diagnosis not present

## 2021-02-15 DIAGNOSIS — I2510 Atherosclerotic heart disease of native coronary artery without angina pectoris: Secondary | ICD-10-CM | POA: Diagnosis not present

## 2021-02-16 DIAGNOSIS — R5381 Other malaise: Secondary | ICD-10-CM | POA: Diagnosis not present

## 2021-02-16 DIAGNOSIS — I2510 Atherosclerotic heart disease of native coronary artery without angina pectoris: Secondary | ICD-10-CM | POA: Diagnosis not present

## 2021-02-16 DIAGNOSIS — D649 Anemia, unspecified: Secondary | ICD-10-CM | POA: Diagnosis not present

## 2021-02-16 DIAGNOSIS — K219 Gastro-esophageal reflux disease without esophagitis: Secondary | ICD-10-CM | POA: Diagnosis not present

## 2021-02-16 DIAGNOSIS — I10 Essential (primary) hypertension: Secondary | ICD-10-CM | POA: Diagnosis not present

## 2021-02-16 DIAGNOSIS — K5900 Constipation, unspecified: Secondary | ICD-10-CM | POA: Diagnosis not present

## 2021-02-16 DIAGNOSIS — R21 Rash and other nonspecific skin eruption: Secondary | ICD-10-CM | POA: Diagnosis not present

## 2021-02-16 DIAGNOSIS — N186 End stage renal disease: Secondary | ICD-10-CM | POA: Diagnosis not present

## 2021-02-17 DIAGNOSIS — S42301D Unspecified fracture of shaft of humerus, right arm, subsequent encounter for fracture with routine healing: Secondary | ICD-10-CM | POA: Diagnosis not present

## 2021-02-17 DIAGNOSIS — K219 Gastro-esophageal reflux disease without esophagitis: Secondary | ICD-10-CM | POA: Diagnosis not present

## 2021-02-17 DIAGNOSIS — I2510 Atherosclerotic heart disease of native coronary artery without angina pectoris: Secondary | ICD-10-CM | POA: Diagnosis not present

## 2021-02-17 DIAGNOSIS — N186 End stage renal disease: Secondary | ICD-10-CM | POA: Diagnosis not present

## 2021-02-17 DIAGNOSIS — R011 Cardiac murmur, unspecified: Secondary | ICD-10-CM | POA: Diagnosis not present

## 2021-02-17 DIAGNOSIS — I10 Essential (primary) hypertension: Secondary | ICD-10-CM | POA: Diagnosis not present

## 2021-02-17 DIAGNOSIS — L309 Dermatitis, unspecified: Secondary | ICD-10-CM | POA: Diagnosis not present

## 2021-02-17 DIAGNOSIS — Z9181 History of falling: Secondary | ICD-10-CM | POA: Diagnosis not present

## 2021-02-17 DIAGNOSIS — R5381 Other malaise: Secondary | ICD-10-CM | POA: Diagnosis not present

## 2021-02-17 DIAGNOSIS — I120 Hypertensive chronic kidney disease with stage 5 chronic kidney disease or end stage renal disease: Secondary | ICD-10-CM | POA: Diagnosis not present

## 2021-02-17 DIAGNOSIS — R634 Abnormal weight loss: Secondary | ICD-10-CM | POA: Diagnosis not present

## 2021-02-17 DIAGNOSIS — I252 Old myocardial infarction: Secondary | ICD-10-CM | POA: Diagnosis not present

## 2021-02-17 DIAGNOSIS — D649 Anemia, unspecified: Secondary | ICD-10-CM | POA: Diagnosis not present

## 2021-02-17 DIAGNOSIS — K5900 Constipation, unspecified: Secondary | ICD-10-CM | POA: Diagnosis not present

## 2021-02-17 DIAGNOSIS — R21 Rash and other nonspecific skin eruption: Secondary | ICD-10-CM | POA: Diagnosis not present

## 2021-02-17 DIAGNOSIS — D631 Anemia in chronic kidney disease: Secondary | ICD-10-CM | POA: Diagnosis not present

## 2021-02-17 DIAGNOSIS — Z6820 Body mass index (BMI) 20.0-20.9, adult: Secondary | ICD-10-CM | POA: Diagnosis not present

## 2021-02-17 DIAGNOSIS — Z8042 Family history of malignant neoplasm of prostate: Secondary | ICD-10-CM | POA: Diagnosis not present

## 2021-02-17 DIAGNOSIS — E785 Hyperlipidemia, unspecified: Secondary | ICD-10-CM | POA: Diagnosis not present

## 2021-02-20 DIAGNOSIS — I2510 Atherosclerotic heart disease of native coronary artery without angina pectoris: Secondary | ICD-10-CM | POA: Diagnosis not present

## 2021-02-20 DIAGNOSIS — R011 Cardiac murmur, unspecified: Secondary | ICD-10-CM | POA: Diagnosis not present

## 2021-02-20 DIAGNOSIS — D631 Anemia in chronic kidney disease: Secondary | ICD-10-CM | POA: Diagnosis not present

## 2021-02-20 DIAGNOSIS — I120 Hypertensive chronic kidney disease with stage 5 chronic kidney disease or end stage renal disease: Secondary | ICD-10-CM | POA: Diagnosis not present

## 2021-02-20 DIAGNOSIS — N186 End stage renal disease: Secondary | ICD-10-CM | POA: Diagnosis not present

## 2021-02-20 DIAGNOSIS — E785 Hyperlipidemia, unspecified: Secondary | ICD-10-CM | POA: Diagnosis not present

## 2021-02-22 DIAGNOSIS — E785 Hyperlipidemia, unspecified: Secondary | ICD-10-CM | POA: Diagnosis not present

## 2021-02-22 DIAGNOSIS — I2510 Atherosclerotic heart disease of native coronary artery without angina pectoris: Secondary | ICD-10-CM | POA: Diagnosis not present

## 2021-02-22 DIAGNOSIS — R011 Cardiac murmur, unspecified: Secondary | ICD-10-CM | POA: Diagnosis not present

## 2021-02-22 DIAGNOSIS — D631 Anemia in chronic kidney disease: Secondary | ICD-10-CM | POA: Diagnosis not present

## 2021-02-22 DIAGNOSIS — N186 End stage renal disease: Secondary | ICD-10-CM | POA: Diagnosis not present

## 2021-02-22 DIAGNOSIS — I120 Hypertensive chronic kidney disease with stage 5 chronic kidney disease or end stage renal disease: Secondary | ICD-10-CM | POA: Diagnosis not present

## 2021-02-24 DIAGNOSIS — D631 Anemia in chronic kidney disease: Secondary | ICD-10-CM | POA: Diagnosis not present

## 2021-02-24 DIAGNOSIS — R011 Cardiac murmur, unspecified: Secondary | ICD-10-CM | POA: Diagnosis not present

## 2021-02-24 DIAGNOSIS — I2510 Atherosclerotic heart disease of native coronary artery without angina pectoris: Secondary | ICD-10-CM | POA: Diagnosis not present

## 2021-02-24 DIAGNOSIS — I120 Hypertensive chronic kidney disease with stage 5 chronic kidney disease or end stage renal disease: Secondary | ICD-10-CM | POA: Diagnosis not present

## 2021-02-24 DIAGNOSIS — E785 Hyperlipidemia, unspecified: Secondary | ICD-10-CM | POA: Diagnosis not present

## 2021-02-24 DIAGNOSIS — N186 End stage renal disease: Secondary | ICD-10-CM | POA: Diagnosis not present

## 2021-02-27 DIAGNOSIS — N186 End stage renal disease: Secondary | ICD-10-CM | POA: Diagnosis not present

## 2021-02-27 DIAGNOSIS — E785 Hyperlipidemia, unspecified: Secondary | ICD-10-CM | POA: Diagnosis not present

## 2021-02-27 DIAGNOSIS — I2510 Atherosclerotic heart disease of native coronary artery without angina pectoris: Secondary | ICD-10-CM | POA: Diagnosis not present

## 2021-02-27 DIAGNOSIS — D631 Anemia in chronic kidney disease: Secondary | ICD-10-CM | POA: Diagnosis not present

## 2021-02-27 DIAGNOSIS — I120 Hypertensive chronic kidney disease with stage 5 chronic kidney disease or end stage renal disease: Secondary | ICD-10-CM | POA: Diagnosis not present

## 2021-02-27 DIAGNOSIS — R011 Cardiac murmur, unspecified: Secondary | ICD-10-CM | POA: Diagnosis not present

## 2021-02-28 DIAGNOSIS — R011 Cardiac murmur, unspecified: Secondary | ICD-10-CM | POA: Diagnosis not present

## 2021-02-28 DIAGNOSIS — E785 Hyperlipidemia, unspecified: Secondary | ICD-10-CM | POA: Diagnosis not present

## 2021-02-28 DIAGNOSIS — D631 Anemia in chronic kidney disease: Secondary | ICD-10-CM | POA: Diagnosis not present

## 2021-02-28 DIAGNOSIS — N186 End stage renal disease: Secondary | ICD-10-CM | POA: Diagnosis not present

## 2021-02-28 DIAGNOSIS — I2510 Atherosclerotic heart disease of native coronary artery without angina pectoris: Secondary | ICD-10-CM | POA: Diagnosis not present

## 2021-02-28 DIAGNOSIS — I120 Hypertensive chronic kidney disease with stage 5 chronic kidney disease or end stage renal disease: Secondary | ICD-10-CM | POA: Diagnosis not present

## 2021-03-01 DIAGNOSIS — I2510 Atherosclerotic heart disease of native coronary artery without angina pectoris: Secondary | ICD-10-CM | POA: Diagnosis not present

## 2021-03-01 DIAGNOSIS — N186 End stage renal disease: Secondary | ICD-10-CM | POA: Diagnosis not present

## 2021-03-01 DIAGNOSIS — D631 Anemia in chronic kidney disease: Secondary | ICD-10-CM | POA: Diagnosis not present

## 2021-03-01 DIAGNOSIS — R011 Cardiac murmur, unspecified: Secondary | ICD-10-CM | POA: Diagnosis not present

## 2021-03-01 DIAGNOSIS — E785 Hyperlipidemia, unspecified: Secondary | ICD-10-CM | POA: Diagnosis not present

## 2021-03-01 DIAGNOSIS — I120 Hypertensive chronic kidney disease with stage 5 chronic kidney disease or end stage renal disease: Secondary | ICD-10-CM | POA: Diagnosis not present

## 2021-03-02 DIAGNOSIS — I120 Hypertensive chronic kidney disease with stage 5 chronic kidney disease or end stage renal disease: Secondary | ICD-10-CM | POA: Diagnosis not present

## 2021-03-02 DIAGNOSIS — I2510 Atherosclerotic heart disease of native coronary artery without angina pectoris: Secondary | ICD-10-CM | POA: Diagnosis not present

## 2021-03-02 DIAGNOSIS — N186 End stage renal disease: Secondary | ICD-10-CM | POA: Diagnosis not present

## 2021-03-02 DIAGNOSIS — D631 Anemia in chronic kidney disease: Secondary | ICD-10-CM | POA: Diagnosis not present

## 2021-03-02 DIAGNOSIS — E785 Hyperlipidemia, unspecified: Secondary | ICD-10-CM | POA: Diagnosis not present

## 2021-03-02 DIAGNOSIS — R011 Cardiac murmur, unspecified: Secondary | ICD-10-CM | POA: Diagnosis not present

## 2021-03-03 DIAGNOSIS — R011 Cardiac murmur, unspecified: Secondary | ICD-10-CM | POA: Diagnosis not present

## 2021-03-03 DIAGNOSIS — E785 Hyperlipidemia, unspecified: Secondary | ICD-10-CM | POA: Diagnosis not present

## 2021-03-03 DIAGNOSIS — I2510 Atherosclerotic heart disease of native coronary artery without angina pectoris: Secondary | ICD-10-CM | POA: Diagnosis not present

## 2021-03-03 DIAGNOSIS — D631 Anemia in chronic kidney disease: Secondary | ICD-10-CM | POA: Diagnosis not present

## 2021-03-03 DIAGNOSIS — I120 Hypertensive chronic kidney disease with stage 5 chronic kidney disease or end stage renal disease: Secondary | ICD-10-CM | POA: Diagnosis not present

## 2021-03-03 DIAGNOSIS — N186 End stage renal disease: Secondary | ICD-10-CM | POA: Diagnosis not present

## 2021-03-08 DIAGNOSIS — I2510 Atherosclerotic heart disease of native coronary artery without angina pectoris: Secondary | ICD-10-CM | POA: Diagnosis not present

## 2021-03-08 DIAGNOSIS — E785 Hyperlipidemia, unspecified: Secondary | ICD-10-CM | POA: Diagnosis not present

## 2021-03-08 DIAGNOSIS — I120 Hypertensive chronic kidney disease with stage 5 chronic kidney disease or end stage renal disease: Secondary | ICD-10-CM | POA: Diagnosis not present

## 2021-03-08 DIAGNOSIS — R011 Cardiac murmur, unspecified: Secondary | ICD-10-CM | POA: Diagnosis not present

## 2021-03-08 DIAGNOSIS — N186 End stage renal disease: Secondary | ICD-10-CM | POA: Diagnosis not present

## 2021-03-08 DIAGNOSIS — D631 Anemia in chronic kidney disease: Secondary | ICD-10-CM | POA: Diagnosis not present

## 2021-03-10 DIAGNOSIS — I2510 Atherosclerotic heart disease of native coronary artery without angina pectoris: Secondary | ICD-10-CM | POA: Diagnosis not present

## 2021-03-10 DIAGNOSIS — N186 End stage renal disease: Secondary | ICD-10-CM | POA: Diagnosis not present

## 2021-03-10 DIAGNOSIS — D631 Anemia in chronic kidney disease: Secondary | ICD-10-CM | POA: Diagnosis not present

## 2021-03-10 DIAGNOSIS — E785 Hyperlipidemia, unspecified: Secondary | ICD-10-CM | POA: Diagnosis not present

## 2021-03-10 DIAGNOSIS — R011 Cardiac murmur, unspecified: Secondary | ICD-10-CM | POA: Diagnosis not present

## 2021-03-10 DIAGNOSIS — I120 Hypertensive chronic kidney disease with stage 5 chronic kidney disease or end stage renal disease: Secondary | ICD-10-CM | POA: Diagnosis not present

## 2021-03-10 DIAGNOSIS — Z23 Encounter for immunization: Secondary | ICD-10-CM | POA: Diagnosis not present

## 2021-03-15 DIAGNOSIS — I120 Hypertensive chronic kidney disease with stage 5 chronic kidney disease or end stage renal disease: Secondary | ICD-10-CM | POA: Diagnosis not present

## 2021-03-15 DIAGNOSIS — D631 Anemia in chronic kidney disease: Secondary | ICD-10-CM | POA: Diagnosis not present

## 2021-03-15 DIAGNOSIS — N186 End stage renal disease: Secondary | ICD-10-CM | POA: Diagnosis not present

## 2021-03-15 DIAGNOSIS — R011 Cardiac murmur, unspecified: Secondary | ICD-10-CM | POA: Diagnosis not present

## 2021-03-15 DIAGNOSIS — I2510 Atherosclerotic heart disease of native coronary artery without angina pectoris: Secondary | ICD-10-CM | POA: Diagnosis not present

## 2021-03-15 DIAGNOSIS — E785 Hyperlipidemia, unspecified: Secondary | ICD-10-CM | POA: Diagnosis not present

## 2021-03-17 DIAGNOSIS — N186 End stage renal disease: Secondary | ICD-10-CM | POA: Diagnosis not present

## 2021-03-17 DIAGNOSIS — I2510 Atherosclerotic heart disease of native coronary artery without angina pectoris: Secondary | ICD-10-CM | POA: Diagnosis not present

## 2021-03-17 DIAGNOSIS — D631 Anemia in chronic kidney disease: Secondary | ICD-10-CM | POA: Diagnosis not present

## 2021-03-17 DIAGNOSIS — I120 Hypertensive chronic kidney disease with stage 5 chronic kidney disease or end stage renal disease: Secondary | ICD-10-CM | POA: Diagnosis not present

## 2021-03-17 DIAGNOSIS — E785 Hyperlipidemia, unspecified: Secondary | ICD-10-CM | POA: Diagnosis not present

## 2021-03-17 DIAGNOSIS — R011 Cardiac murmur, unspecified: Secondary | ICD-10-CM | POA: Diagnosis not present

## 2021-03-20 DIAGNOSIS — D631 Anemia in chronic kidney disease: Secondary | ICD-10-CM | POA: Diagnosis not present

## 2021-03-20 DIAGNOSIS — K219 Gastro-esophageal reflux disease without esophagitis: Secondary | ICD-10-CM | POA: Diagnosis not present

## 2021-03-20 DIAGNOSIS — E785 Hyperlipidemia, unspecified: Secondary | ICD-10-CM | POA: Diagnosis not present

## 2021-03-20 DIAGNOSIS — L309 Dermatitis, unspecified: Secondary | ICD-10-CM | POA: Diagnosis not present

## 2021-03-20 DIAGNOSIS — I252 Old myocardial infarction: Secondary | ICD-10-CM | POA: Diagnosis not present

## 2021-03-20 DIAGNOSIS — I120 Hypertensive chronic kidney disease with stage 5 chronic kidney disease or end stage renal disease: Secondary | ICD-10-CM | POA: Diagnosis not present

## 2021-03-20 DIAGNOSIS — R634 Abnormal weight loss: Secondary | ICD-10-CM | POA: Diagnosis not present

## 2021-03-20 DIAGNOSIS — I2510 Atherosclerotic heart disease of native coronary artery without angina pectoris: Secondary | ICD-10-CM | POA: Diagnosis not present

## 2021-03-20 DIAGNOSIS — R011 Cardiac murmur, unspecified: Secondary | ICD-10-CM | POA: Diagnosis not present

## 2021-03-20 DIAGNOSIS — Z9181 History of falling: Secondary | ICD-10-CM | POA: Diagnosis not present

## 2021-03-20 DIAGNOSIS — S42301D Unspecified fracture of shaft of humerus, right arm, subsequent encounter for fracture with routine healing: Secondary | ICD-10-CM | POA: Diagnosis not present

## 2021-03-20 DIAGNOSIS — Z681 Body mass index (BMI) 19 or less, adult: Secondary | ICD-10-CM | POA: Diagnosis not present

## 2021-03-20 DIAGNOSIS — Z8042 Family history of malignant neoplasm of prostate: Secondary | ICD-10-CM | POA: Diagnosis not present

## 2021-03-20 DIAGNOSIS — N186 End stage renal disease: Secondary | ICD-10-CM | POA: Diagnosis not present

## 2021-03-22 DIAGNOSIS — E785 Hyperlipidemia, unspecified: Secondary | ICD-10-CM | POA: Diagnosis not present

## 2021-03-22 DIAGNOSIS — I2510 Atherosclerotic heart disease of native coronary artery without angina pectoris: Secondary | ICD-10-CM | POA: Diagnosis not present

## 2021-03-22 DIAGNOSIS — N186 End stage renal disease: Secondary | ICD-10-CM | POA: Diagnosis not present

## 2021-03-22 DIAGNOSIS — I120 Hypertensive chronic kidney disease with stage 5 chronic kidney disease or end stage renal disease: Secondary | ICD-10-CM | POA: Diagnosis not present

## 2021-03-22 DIAGNOSIS — R011 Cardiac murmur, unspecified: Secondary | ICD-10-CM | POA: Diagnosis not present

## 2021-03-22 DIAGNOSIS — D631 Anemia in chronic kidney disease: Secondary | ICD-10-CM | POA: Diagnosis not present

## 2021-03-24 DIAGNOSIS — R21 Rash and other nonspecific skin eruption: Secondary | ICD-10-CM | POA: Diagnosis not present

## 2021-03-24 DIAGNOSIS — N186 End stage renal disease: Secondary | ICD-10-CM | POA: Diagnosis not present

## 2021-03-24 DIAGNOSIS — D649 Anemia, unspecified: Secondary | ICD-10-CM | POA: Diagnosis not present

## 2021-03-24 DIAGNOSIS — I2510 Atherosclerotic heart disease of native coronary artery without angina pectoris: Secondary | ICD-10-CM | POA: Diagnosis not present

## 2021-03-24 DIAGNOSIS — R011 Cardiac murmur, unspecified: Secondary | ICD-10-CM | POA: Diagnosis not present

## 2021-03-24 DIAGNOSIS — I10 Essential (primary) hypertension: Secondary | ICD-10-CM | POA: Diagnosis not present

## 2021-03-24 DIAGNOSIS — K5900 Constipation, unspecified: Secondary | ICD-10-CM | POA: Diagnosis not present

## 2021-03-24 DIAGNOSIS — E785 Hyperlipidemia, unspecified: Secondary | ICD-10-CM | POA: Diagnosis not present

## 2021-03-24 DIAGNOSIS — R5381 Other malaise: Secondary | ICD-10-CM | POA: Diagnosis not present

## 2021-03-24 DIAGNOSIS — K219 Gastro-esophageal reflux disease without esophagitis: Secondary | ICD-10-CM | POA: Diagnosis not present

## 2021-03-24 DIAGNOSIS — I120 Hypertensive chronic kidney disease with stage 5 chronic kidney disease or end stage renal disease: Secondary | ICD-10-CM | POA: Diagnosis not present

## 2021-03-24 DIAGNOSIS — D631 Anemia in chronic kidney disease: Secondary | ICD-10-CM | POA: Diagnosis not present

## 2021-03-28 DIAGNOSIS — I2510 Atherosclerotic heart disease of native coronary artery without angina pectoris: Secondary | ICD-10-CM | POA: Diagnosis not present

## 2021-03-28 DIAGNOSIS — I120 Hypertensive chronic kidney disease with stage 5 chronic kidney disease or end stage renal disease: Secondary | ICD-10-CM | POA: Diagnosis not present

## 2021-03-28 DIAGNOSIS — N186 End stage renal disease: Secondary | ICD-10-CM | POA: Diagnosis not present

## 2021-03-28 DIAGNOSIS — D631 Anemia in chronic kidney disease: Secondary | ICD-10-CM | POA: Diagnosis not present

## 2021-03-28 DIAGNOSIS — E785 Hyperlipidemia, unspecified: Secondary | ICD-10-CM | POA: Diagnosis not present

## 2021-03-28 DIAGNOSIS — R011 Cardiac murmur, unspecified: Secondary | ICD-10-CM | POA: Diagnosis not present

## 2021-03-29 DIAGNOSIS — I120 Hypertensive chronic kidney disease with stage 5 chronic kidney disease or end stage renal disease: Secondary | ICD-10-CM | POA: Diagnosis not present

## 2021-03-29 DIAGNOSIS — E785 Hyperlipidemia, unspecified: Secondary | ICD-10-CM | POA: Diagnosis not present

## 2021-03-29 DIAGNOSIS — D631 Anemia in chronic kidney disease: Secondary | ICD-10-CM | POA: Diagnosis not present

## 2021-03-29 DIAGNOSIS — N186 End stage renal disease: Secondary | ICD-10-CM | POA: Diagnosis not present

## 2021-03-29 DIAGNOSIS — L89312 Pressure ulcer of right buttock, stage 2: Secondary | ICD-10-CM | POA: Diagnosis not present

## 2021-03-29 DIAGNOSIS — I2510 Atherosclerotic heart disease of native coronary artery without angina pectoris: Secondary | ICD-10-CM | POA: Diagnosis not present

## 2021-03-29 DIAGNOSIS — R011 Cardiac murmur, unspecified: Secondary | ICD-10-CM | POA: Diagnosis not present

## 2021-03-30 DIAGNOSIS — N186 End stage renal disease: Secondary | ICD-10-CM | POA: Diagnosis not present

## 2021-03-30 DIAGNOSIS — E785 Hyperlipidemia, unspecified: Secondary | ICD-10-CM | POA: Diagnosis not present

## 2021-03-30 DIAGNOSIS — I2510 Atherosclerotic heart disease of native coronary artery without angina pectoris: Secondary | ICD-10-CM | POA: Diagnosis not present

## 2021-03-30 DIAGNOSIS — I120 Hypertensive chronic kidney disease with stage 5 chronic kidney disease or end stage renal disease: Secondary | ICD-10-CM | POA: Diagnosis not present

## 2021-03-30 DIAGNOSIS — D631 Anemia in chronic kidney disease: Secondary | ICD-10-CM | POA: Diagnosis not present

## 2021-03-30 DIAGNOSIS — R011 Cardiac murmur, unspecified: Secondary | ICD-10-CM | POA: Diagnosis not present

## 2021-03-31 DIAGNOSIS — I120 Hypertensive chronic kidney disease with stage 5 chronic kidney disease or end stage renal disease: Secondary | ICD-10-CM | POA: Diagnosis not present

## 2021-03-31 DIAGNOSIS — E785 Hyperlipidemia, unspecified: Secondary | ICD-10-CM | POA: Diagnosis not present

## 2021-03-31 DIAGNOSIS — R011 Cardiac murmur, unspecified: Secondary | ICD-10-CM | POA: Diagnosis not present

## 2021-03-31 DIAGNOSIS — I2510 Atherosclerotic heart disease of native coronary artery without angina pectoris: Secondary | ICD-10-CM | POA: Diagnosis not present

## 2021-03-31 DIAGNOSIS — N186 End stage renal disease: Secondary | ICD-10-CM | POA: Diagnosis not present

## 2021-03-31 DIAGNOSIS — D631 Anemia in chronic kidney disease: Secondary | ICD-10-CM | POA: Diagnosis not present

## 2021-04-04 DIAGNOSIS — I2510 Atherosclerotic heart disease of native coronary artery without angina pectoris: Secondary | ICD-10-CM | POA: Diagnosis not present

## 2021-04-04 DIAGNOSIS — E46 Unspecified protein-calorie malnutrition: Secondary | ICD-10-CM | POA: Diagnosis not present

## 2021-04-04 DIAGNOSIS — N186 End stage renal disease: Secondary | ICD-10-CM | POA: Diagnosis not present

## 2021-04-04 DIAGNOSIS — D649 Anemia, unspecified: Secondary | ICD-10-CM | POA: Diagnosis not present

## 2021-04-04 DIAGNOSIS — K219 Gastro-esophageal reflux disease without esophagitis: Secondary | ICD-10-CM | POA: Diagnosis not present

## 2021-04-04 DIAGNOSIS — K5900 Constipation, unspecified: Secondary | ICD-10-CM | POA: Diagnosis not present

## 2021-04-04 DIAGNOSIS — R5381 Other malaise: Secondary | ICD-10-CM | POA: Diagnosis not present

## 2021-04-04 DIAGNOSIS — I10 Essential (primary) hypertension: Secondary | ICD-10-CM | POA: Diagnosis not present

## 2021-04-05 DIAGNOSIS — D631 Anemia in chronic kidney disease: Secondary | ICD-10-CM | POA: Diagnosis not present

## 2021-04-05 DIAGNOSIS — N186 End stage renal disease: Secondary | ICD-10-CM | POA: Diagnosis not present

## 2021-04-05 DIAGNOSIS — I120 Hypertensive chronic kidney disease with stage 5 chronic kidney disease or end stage renal disease: Secondary | ICD-10-CM | POA: Diagnosis not present

## 2021-04-05 DIAGNOSIS — R011 Cardiac murmur, unspecified: Secondary | ICD-10-CM | POA: Diagnosis not present

## 2021-04-05 DIAGNOSIS — I2510 Atherosclerotic heart disease of native coronary artery without angina pectoris: Secondary | ICD-10-CM | POA: Diagnosis not present

## 2021-04-05 DIAGNOSIS — E785 Hyperlipidemia, unspecified: Secondary | ICD-10-CM | POA: Diagnosis not present

## 2021-04-05 DIAGNOSIS — L89312 Pressure ulcer of right buttock, stage 2: Secondary | ICD-10-CM | POA: Diagnosis not present

## 2021-04-07 DIAGNOSIS — E785 Hyperlipidemia, unspecified: Secondary | ICD-10-CM | POA: Diagnosis not present

## 2021-04-07 DIAGNOSIS — R011 Cardiac murmur, unspecified: Secondary | ICD-10-CM | POA: Diagnosis not present

## 2021-04-07 DIAGNOSIS — N186 End stage renal disease: Secondary | ICD-10-CM | POA: Diagnosis not present

## 2021-04-07 DIAGNOSIS — D631 Anemia in chronic kidney disease: Secondary | ICD-10-CM | POA: Diagnosis not present

## 2021-04-07 DIAGNOSIS — I2510 Atherosclerotic heart disease of native coronary artery without angina pectoris: Secondary | ICD-10-CM | POA: Diagnosis not present

## 2021-04-07 DIAGNOSIS — I120 Hypertensive chronic kidney disease with stage 5 chronic kidney disease or end stage renal disease: Secondary | ICD-10-CM | POA: Diagnosis not present

## 2021-04-12 DIAGNOSIS — I2510 Atherosclerotic heart disease of native coronary artery without angina pectoris: Secondary | ICD-10-CM | POA: Diagnosis not present

## 2021-04-12 DIAGNOSIS — E785 Hyperlipidemia, unspecified: Secondary | ICD-10-CM | POA: Diagnosis not present

## 2021-04-12 DIAGNOSIS — R011 Cardiac murmur, unspecified: Secondary | ICD-10-CM | POA: Diagnosis not present

## 2021-04-12 DIAGNOSIS — D631 Anemia in chronic kidney disease: Secondary | ICD-10-CM | POA: Diagnosis not present

## 2021-04-12 DIAGNOSIS — I120 Hypertensive chronic kidney disease with stage 5 chronic kidney disease or end stage renal disease: Secondary | ICD-10-CM | POA: Diagnosis not present

## 2021-04-12 DIAGNOSIS — N186 End stage renal disease: Secondary | ICD-10-CM | POA: Diagnosis not present

## 2021-04-12 DIAGNOSIS — L89312 Pressure ulcer of right buttock, stage 2: Secondary | ICD-10-CM | POA: Diagnosis not present

## 2021-04-13 DIAGNOSIS — D631 Anemia in chronic kidney disease: Secondary | ICD-10-CM | POA: Diagnosis not present

## 2021-04-13 DIAGNOSIS — N186 End stage renal disease: Secondary | ICD-10-CM | POA: Diagnosis not present

## 2021-04-13 DIAGNOSIS — E785 Hyperlipidemia, unspecified: Secondary | ICD-10-CM | POA: Diagnosis not present

## 2021-04-13 DIAGNOSIS — I120 Hypertensive chronic kidney disease with stage 5 chronic kidney disease or end stage renal disease: Secondary | ICD-10-CM | POA: Diagnosis not present

## 2021-04-13 DIAGNOSIS — I2510 Atherosclerotic heart disease of native coronary artery without angina pectoris: Secondary | ICD-10-CM | POA: Diagnosis not present

## 2021-04-13 DIAGNOSIS — R011 Cardiac murmur, unspecified: Secondary | ICD-10-CM | POA: Diagnosis not present

## 2021-04-14 DIAGNOSIS — R011 Cardiac murmur, unspecified: Secondary | ICD-10-CM | POA: Diagnosis not present

## 2021-04-14 DIAGNOSIS — N186 End stage renal disease: Secondary | ICD-10-CM | POA: Diagnosis not present

## 2021-04-14 DIAGNOSIS — E785 Hyperlipidemia, unspecified: Secondary | ICD-10-CM | POA: Diagnosis not present

## 2021-04-14 DIAGNOSIS — I2510 Atherosclerotic heart disease of native coronary artery without angina pectoris: Secondary | ICD-10-CM | POA: Diagnosis not present

## 2021-04-14 DIAGNOSIS — D631 Anemia in chronic kidney disease: Secondary | ICD-10-CM | POA: Diagnosis not present

## 2021-04-14 DIAGNOSIS — I120 Hypertensive chronic kidney disease with stage 5 chronic kidney disease or end stage renal disease: Secondary | ICD-10-CM | POA: Diagnosis not present

## 2021-04-19 DIAGNOSIS — I120 Hypertensive chronic kidney disease with stage 5 chronic kidney disease or end stage renal disease: Secondary | ICD-10-CM | POA: Diagnosis not present

## 2021-04-19 DIAGNOSIS — I2510 Atherosclerotic heart disease of native coronary artery without angina pectoris: Secondary | ICD-10-CM | POA: Diagnosis not present

## 2021-04-19 DIAGNOSIS — N186 End stage renal disease: Secondary | ICD-10-CM | POA: Diagnosis not present

## 2021-04-19 DIAGNOSIS — L89312 Pressure ulcer of right buttock, stage 2: Secondary | ICD-10-CM | POA: Diagnosis not present

## 2021-04-19 DIAGNOSIS — D631 Anemia in chronic kidney disease: Secondary | ICD-10-CM | POA: Diagnosis not present

## 2021-04-19 DIAGNOSIS — E785 Hyperlipidemia, unspecified: Secondary | ICD-10-CM | POA: Diagnosis not present

## 2021-04-19 DIAGNOSIS — R011 Cardiac murmur, unspecified: Secondary | ICD-10-CM | POA: Diagnosis not present

## 2021-04-20 DIAGNOSIS — N186 End stage renal disease: Secondary | ICD-10-CM | POA: Diagnosis not present

## 2021-04-20 DIAGNOSIS — Z9181 History of falling: Secondary | ICD-10-CM | POA: Diagnosis not present

## 2021-04-20 DIAGNOSIS — I252 Old myocardial infarction: Secondary | ICD-10-CM | POA: Diagnosis not present

## 2021-04-20 DIAGNOSIS — Z8042 Family history of malignant neoplasm of prostate: Secondary | ICD-10-CM | POA: Diagnosis not present

## 2021-04-20 DIAGNOSIS — I2510 Atherosclerotic heart disease of native coronary artery without angina pectoris: Secondary | ICD-10-CM | POA: Diagnosis not present

## 2021-04-20 DIAGNOSIS — E785 Hyperlipidemia, unspecified: Secondary | ICD-10-CM | POA: Diagnosis not present

## 2021-04-20 DIAGNOSIS — I120 Hypertensive chronic kidney disease with stage 5 chronic kidney disease or end stage renal disease: Secondary | ICD-10-CM | POA: Diagnosis not present

## 2021-04-20 DIAGNOSIS — K219 Gastro-esophageal reflux disease without esophagitis: Secondary | ICD-10-CM | POA: Diagnosis not present

## 2021-04-20 DIAGNOSIS — R011 Cardiac murmur, unspecified: Secondary | ICD-10-CM | POA: Diagnosis not present

## 2021-04-20 DIAGNOSIS — S42301D Unspecified fracture of shaft of humerus, right arm, subsequent encounter for fracture with routine healing: Secondary | ICD-10-CM | POA: Diagnosis not present

## 2021-04-20 DIAGNOSIS — R634 Abnormal weight loss: Secondary | ICD-10-CM | POA: Diagnosis not present

## 2021-04-20 DIAGNOSIS — L309 Dermatitis, unspecified: Secondary | ICD-10-CM | POA: Diagnosis not present

## 2021-04-20 DIAGNOSIS — Z681 Body mass index (BMI) 19 or less, adult: Secondary | ICD-10-CM | POA: Diagnosis not present

## 2021-04-20 DIAGNOSIS — D631 Anemia in chronic kidney disease: Secondary | ICD-10-CM | POA: Diagnosis not present

## 2021-04-21 DIAGNOSIS — I10 Essential (primary) hypertension: Secondary | ICD-10-CM | POA: Diagnosis not present

## 2021-04-21 DIAGNOSIS — K219 Gastro-esophageal reflux disease without esophagitis: Secondary | ICD-10-CM | POA: Diagnosis not present

## 2021-04-21 DIAGNOSIS — D631 Anemia in chronic kidney disease: Secondary | ICD-10-CM | POA: Diagnosis not present

## 2021-04-21 DIAGNOSIS — N186 End stage renal disease: Secondary | ICD-10-CM | POA: Diagnosis not present

## 2021-04-21 DIAGNOSIS — I120 Hypertensive chronic kidney disease with stage 5 chronic kidney disease or end stage renal disease: Secondary | ICD-10-CM | POA: Diagnosis not present

## 2021-04-21 DIAGNOSIS — E785 Hyperlipidemia, unspecified: Secondary | ICD-10-CM | POA: Diagnosis not present

## 2021-04-21 DIAGNOSIS — R011 Cardiac murmur, unspecified: Secondary | ICD-10-CM | POA: Diagnosis not present

## 2021-04-21 DIAGNOSIS — E46 Unspecified protein-calorie malnutrition: Secondary | ICD-10-CM | POA: Diagnosis not present

## 2021-04-21 DIAGNOSIS — K5900 Constipation, unspecified: Secondary | ICD-10-CM | POA: Diagnosis not present

## 2021-04-21 DIAGNOSIS — I2510 Atherosclerotic heart disease of native coronary artery without angina pectoris: Secondary | ICD-10-CM | POA: Diagnosis not present

## 2021-04-21 DIAGNOSIS — L89312 Pressure ulcer of right buttock, stage 2: Secondary | ICD-10-CM | POA: Diagnosis not present

## 2021-04-21 DIAGNOSIS — R5381 Other malaise: Secondary | ICD-10-CM | POA: Diagnosis not present

## 2021-04-21 DIAGNOSIS — S42301D Unspecified fracture of shaft of humerus, right arm, subsequent encounter for fracture with routine healing: Secondary | ICD-10-CM | POA: Diagnosis not present

## 2021-04-26 DIAGNOSIS — D631 Anemia in chronic kidney disease: Secondary | ICD-10-CM | POA: Diagnosis not present

## 2021-04-26 DIAGNOSIS — I120 Hypertensive chronic kidney disease with stage 5 chronic kidney disease or end stage renal disease: Secondary | ICD-10-CM | POA: Diagnosis not present

## 2021-04-26 DIAGNOSIS — E785 Hyperlipidemia, unspecified: Secondary | ICD-10-CM | POA: Diagnosis not present

## 2021-04-26 DIAGNOSIS — I2510 Atherosclerotic heart disease of native coronary artery without angina pectoris: Secondary | ICD-10-CM | POA: Diagnosis not present

## 2021-04-26 DIAGNOSIS — R011 Cardiac murmur, unspecified: Secondary | ICD-10-CM | POA: Diagnosis not present

## 2021-04-26 DIAGNOSIS — N186 End stage renal disease: Secondary | ICD-10-CM | POA: Diagnosis not present

## 2021-04-27 DIAGNOSIS — D631 Anemia in chronic kidney disease: Secondary | ICD-10-CM | POA: Diagnosis not present

## 2021-04-27 DIAGNOSIS — E785 Hyperlipidemia, unspecified: Secondary | ICD-10-CM | POA: Diagnosis not present

## 2021-04-27 DIAGNOSIS — R5381 Other malaise: Secondary | ICD-10-CM | POA: Diagnosis not present

## 2021-04-27 DIAGNOSIS — D649 Anemia, unspecified: Secondary | ICD-10-CM | POA: Diagnosis not present

## 2021-04-27 DIAGNOSIS — K219 Gastro-esophageal reflux disease without esophagitis: Secondary | ICD-10-CM | POA: Diagnosis not present

## 2021-04-27 DIAGNOSIS — I10 Essential (primary) hypertension: Secondary | ICD-10-CM | POA: Diagnosis not present

## 2021-04-27 DIAGNOSIS — N186 End stage renal disease: Secondary | ICD-10-CM | POA: Diagnosis not present

## 2021-04-27 DIAGNOSIS — R011 Cardiac murmur, unspecified: Secondary | ICD-10-CM | POA: Diagnosis not present

## 2021-04-27 DIAGNOSIS — E46 Unspecified protein-calorie malnutrition: Secondary | ICD-10-CM | POA: Diagnosis not present

## 2021-04-27 DIAGNOSIS — K5900 Constipation, unspecified: Secondary | ICD-10-CM | POA: Diagnosis not present

## 2021-04-27 DIAGNOSIS — I120 Hypertensive chronic kidney disease with stage 5 chronic kidney disease or end stage renal disease: Secondary | ICD-10-CM | POA: Diagnosis not present

## 2021-04-27 DIAGNOSIS — I2510 Atherosclerotic heart disease of native coronary artery without angina pectoris: Secondary | ICD-10-CM | POA: Diagnosis not present

## 2021-04-28 DIAGNOSIS — N186 End stage renal disease: Secondary | ICD-10-CM | POA: Diagnosis not present

## 2021-04-28 DIAGNOSIS — E785 Hyperlipidemia, unspecified: Secondary | ICD-10-CM | POA: Diagnosis not present

## 2021-04-28 DIAGNOSIS — I2510 Atherosclerotic heart disease of native coronary artery without angina pectoris: Secondary | ICD-10-CM | POA: Diagnosis not present

## 2021-04-28 DIAGNOSIS — D631 Anemia in chronic kidney disease: Secondary | ICD-10-CM | POA: Diagnosis not present

## 2021-04-28 DIAGNOSIS — I120 Hypertensive chronic kidney disease with stage 5 chronic kidney disease or end stage renal disease: Secondary | ICD-10-CM | POA: Diagnosis not present

## 2021-04-28 DIAGNOSIS — R011 Cardiac murmur, unspecified: Secondary | ICD-10-CM | POA: Diagnosis not present

## 2021-05-03 DIAGNOSIS — D631 Anemia in chronic kidney disease: Secondary | ICD-10-CM | POA: Diagnosis not present

## 2021-05-03 DIAGNOSIS — E785 Hyperlipidemia, unspecified: Secondary | ICD-10-CM | POA: Diagnosis not present

## 2021-05-03 DIAGNOSIS — N186 End stage renal disease: Secondary | ICD-10-CM | POA: Diagnosis not present

## 2021-05-03 DIAGNOSIS — I120 Hypertensive chronic kidney disease with stage 5 chronic kidney disease or end stage renal disease: Secondary | ICD-10-CM | POA: Diagnosis not present

## 2021-05-03 DIAGNOSIS — R011 Cardiac murmur, unspecified: Secondary | ICD-10-CM | POA: Diagnosis not present

## 2021-05-03 DIAGNOSIS — I2510 Atherosclerotic heart disease of native coronary artery without angina pectoris: Secondary | ICD-10-CM | POA: Diagnosis not present

## 2021-05-05 DIAGNOSIS — D631 Anemia in chronic kidney disease: Secondary | ICD-10-CM | POA: Diagnosis not present

## 2021-05-05 DIAGNOSIS — E785 Hyperlipidemia, unspecified: Secondary | ICD-10-CM | POA: Diagnosis not present

## 2021-05-05 DIAGNOSIS — R011 Cardiac murmur, unspecified: Secondary | ICD-10-CM | POA: Diagnosis not present

## 2021-05-05 DIAGNOSIS — I120 Hypertensive chronic kidney disease with stage 5 chronic kidney disease or end stage renal disease: Secondary | ICD-10-CM | POA: Diagnosis not present

## 2021-05-05 DIAGNOSIS — N186 End stage renal disease: Secondary | ICD-10-CM | POA: Diagnosis not present

## 2021-05-05 DIAGNOSIS — I2510 Atherosclerotic heart disease of native coronary artery without angina pectoris: Secondary | ICD-10-CM | POA: Diagnosis not present

## 2021-05-10 DIAGNOSIS — N186 End stage renal disease: Secondary | ICD-10-CM | POA: Diagnosis not present

## 2021-05-10 DIAGNOSIS — D631 Anemia in chronic kidney disease: Secondary | ICD-10-CM | POA: Diagnosis not present

## 2021-05-10 DIAGNOSIS — R011 Cardiac murmur, unspecified: Secondary | ICD-10-CM | POA: Diagnosis not present

## 2021-05-10 DIAGNOSIS — I120 Hypertensive chronic kidney disease with stage 5 chronic kidney disease or end stage renal disease: Secondary | ICD-10-CM | POA: Diagnosis not present

## 2021-05-10 DIAGNOSIS — E785 Hyperlipidemia, unspecified: Secondary | ICD-10-CM | POA: Diagnosis not present

## 2021-05-10 DIAGNOSIS — I2510 Atherosclerotic heart disease of native coronary artery without angina pectoris: Secondary | ICD-10-CM | POA: Diagnosis not present

## 2021-05-11 DIAGNOSIS — N186 End stage renal disease: Secondary | ICD-10-CM | POA: Diagnosis not present

## 2021-05-11 DIAGNOSIS — D631 Anemia in chronic kidney disease: Secondary | ICD-10-CM | POA: Diagnosis not present

## 2021-05-11 DIAGNOSIS — I2510 Atherosclerotic heart disease of native coronary artery without angina pectoris: Secondary | ICD-10-CM | POA: Diagnosis not present

## 2021-05-11 DIAGNOSIS — E785 Hyperlipidemia, unspecified: Secondary | ICD-10-CM | POA: Diagnosis not present

## 2021-05-11 DIAGNOSIS — I120 Hypertensive chronic kidney disease with stage 5 chronic kidney disease or end stage renal disease: Secondary | ICD-10-CM | POA: Diagnosis not present

## 2021-05-11 DIAGNOSIS — R011 Cardiac murmur, unspecified: Secondary | ICD-10-CM | POA: Diagnosis not present

## 2021-05-12 DIAGNOSIS — I2510 Atherosclerotic heart disease of native coronary artery without angina pectoris: Secondary | ICD-10-CM | POA: Diagnosis not present

## 2021-05-12 DIAGNOSIS — D631 Anemia in chronic kidney disease: Secondary | ICD-10-CM | POA: Diagnosis not present

## 2021-05-12 DIAGNOSIS — I120 Hypertensive chronic kidney disease with stage 5 chronic kidney disease or end stage renal disease: Secondary | ICD-10-CM | POA: Diagnosis not present

## 2021-05-12 DIAGNOSIS — R011 Cardiac murmur, unspecified: Secondary | ICD-10-CM | POA: Diagnosis not present

## 2021-05-12 DIAGNOSIS — N186 End stage renal disease: Secondary | ICD-10-CM | POA: Diagnosis not present

## 2021-05-12 DIAGNOSIS — E785 Hyperlipidemia, unspecified: Secondary | ICD-10-CM | POA: Diagnosis not present

## 2021-05-17 DIAGNOSIS — D631 Anemia in chronic kidney disease: Secondary | ICD-10-CM | POA: Diagnosis not present

## 2021-05-17 DIAGNOSIS — I2510 Atherosclerotic heart disease of native coronary artery without angina pectoris: Secondary | ICD-10-CM | POA: Diagnosis not present

## 2021-05-17 DIAGNOSIS — N186 End stage renal disease: Secondary | ICD-10-CM | POA: Diagnosis not present

## 2021-05-17 DIAGNOSIS — E785 Hyperlipidemia, unspecified: Secondary | ICD-10-CM | POA: Diagnosis not present

## 2021-05-17 DIAGNOSIS — I120 Hypertensive chronic kidney disease with stage 5 chronic kidney disease or end stage renal disease: Secondary | ICD-10-CM | POA: Diagnosis not present

## 2021-05-17 DIAGNOSIS — R011 Cardiac murmur, unspecified: Secondary | ICD-10-CM | POA: Diagnosis not present

## 2021-05-18 DIAGNOSIS — S42301S Unspecified fracture of shaft of humerus, right arm, sequela: Secondary | ICD-10-CM | POA: Diagnosis not present

## 2021-05-18 DIAGNOSIS — Z681 Body mass index (BMI) 19 or less, adult: Secondary | ICD-10-CM | POA: Diagnosis not present

## 2021-05-18 DIAGNOSIS — D631 Anemia in chronic kidney disease: Secondary | ICD-10-CM | POA: Diagnosis not present

## 2021-05-18 DIAGNOSIS — K219 Gastro-esophageal reflux disease without esophagitis: Secondary | ICD-10-CM | POA: Diagnosis not present

## 2021-05-18 DIAGNOSIS — E785 Hyperlipidemia, unspecified: Secondary | ICD-10-CM | POA: Diagnosis not present

## 2021-05-18 DIAGNOSIS — N186 End stage renal disease: Secondary | ICD-10-CM | POA: Diagnosis not present

## 2021-05-18 DIAGNOSIS — I2510 Atherosclerotic heart disease of native coronary artery without angina pectoris: Secondary | ICD-10-CM | POA: Diagnosis not present

## 2021-05-18 DIAGNOSIS — Z9181 History of falling: Secondary | ICD-10-CM | POA: Diagnosis not present

## 2021-05-18 DIAGNOSIS — L309 Dermatitis, unspecified: Secondary | ICD-10-CM | POA: Diagnosis not present

## 2021-05-18 DIAGNOSIS — I120 Hypertensive chronic kidney disease with stage 5 chronic kidney disease or end stage renal disease: Secondary | ICD-10-CM | POA: Diagnosis not present

## 2021-05-18 DIAGNOSIS — R634 Abnormal weight loss: Secondary | ICD-10-CM | POA: Diagnosis not present

## 2021-05-18 DIAGNOSIS — R011 Cardiac murmur, unspecified: Secondary | ICD-10-CM | POA: Diagnosis not present

## 2021-05-18 DIAGNOSIS — I252 Old myocardial infarction: Secondary | ICD-10-CM | POA: Diagnosis not present

## 2021-05-18 DIAGNOSIS — K5900 Constipation, unspecified: Secondary | ICD-10-CM | POA: Diagnosis not present

## 2021-05-18 DIAGNOSIS — Z8042 Family history of malignant neoplasm of prostate: Secondary | ICD-10-CM | POA: Diagnosis not present

## 2021-05-19 DIAGNOSIS — K219 Gastro-esophageal reflux disease without esophagitis: Secondary | ICD-10-CM | POA: Diagnosis not present

## 2021-05-19 DIAGNOSIS — N186 End stage renal disease: Secondary | ICD-10-CM | POA: Diagnosis not present

## 2021-05-19 DIAGNOSIS — D649 Anemia, unspecified: Secondary | ICD-10-CM | POA: Diagnosis not present

## 2021-05-19 DIAGNOSIS — K5900 Constipation, unspecified: Secondary | ICD-10-CM | POA: Diagnosis not present

## 2021-05-19 DIAGNOSIS — D631 Anemia in chronic kidney disease: Secondary | ICD-10-CM | POA: Diagnosis not present

## 2021-05-19 DIAGNOSIS — R5381 Other malaise: Secondary | ICD-10-CM | POA: Diagnosis not present

## 2021-05-19 DIAGNOSIS — R011 Cardiac murmur, unspecified: Secondary | ICD-10-CM | POA: Diagnosis not present

## 2021-05-19 DIAGNOSIS — I2510 Atherosclerotic heart disease of native coronary artery without angina pectoris: Secondary | ICD-10-CM | POA: Diagnosis not present

## 2021-05-19 DIAGNOSIS — E785 Hyperlipidemia, unspecified: Secondary | ICD-10-CM | POA: Diagnosis not present

## 2021-05-19 DIAGNOSIS — I120 Hypertensive chronic kidney disease with stage 5 chronic kidney disease or end stage renal disease: Secondary | ICD-10-CM | POA: Diagnosis not present

## 2021-05-19 DIAGNOSIS — I10 Essential (primary) hypertension: Secondary | ICD-10-CM | POA: Diagnosis not present

## 2021-05-19 DIAGNOSIS — E46 Unspecified protein-calorie malnutrition: Secondary | ICD-10-CM | POA: Diagnosis not present

## 2021-05-20 DIAGNOSIS — I120 Hypertensive chronic kidney disease with stage 5 chronic kidney disease or end stage renal disease: Secondary | ICD-10-CM | POA: Diagnosis not present

## 2021-05-20 DIAGNOSIS — I2510 Atherosclerotic heart disease of native coronary artery without angina pectoris: Secondary | ICD-10-CM | POA: Diagnosis not present

## 2021-05-20 DIAGNOSIS — D631 Anemia in chronic kidney disease: Secondary | ICD-10-CM | POA: Diagnosis not present

## 2021-05-20 DIAGNOSIS — N186 End stage renal disease: Secondary | ICD-10-CM | POA: Diagnosis not present

## 2021-05-20 DIAGNOSIS — R011 Cardiac murmur, unspecified: Secondary | ICD-10-CM | POA: Diagnosis not present

## 2021-05-20 DIAGNOSIS — E785 Hyperlipidemia, unspecified: Secondary | ICD-10-CM | POA: Diagnosis not present

## 2021-05-24 DIAGNOSIS — R011 Cardiac murmur, unspecified: Secondary | ICD-10-CM | POA: Diagnosis not present

## 2021-05-24 DIAGNOSIS — D631 Anemia in chronic kidney disease: Secondary | ICD-10-CM | POA: Diagnosis not present

## 2021-05-24 DIAGNOSIS — I120 Hypertensive chronic kidney disease with stage 5 chronic kidney disease or end stage renal disease: Secondary | ICD-10-CM | POA: Diagnosis not present

## 2021-05-24 DIAGNOSIS — E785 Hyperlipidemia, unspecified: Secondary | ICD-10-CM | POA: Diagnosis not present

## 2021-05-24 DIAGNOSIS — N186 End stage renal disease: Secondary | ICD-10-CM | POA: Diagnosis not present

## 2021-05-24 DIAGNOSIS — I2510 Atherosclerotic heart disease of native coronary artery without angina pectoris: Secondary | ICD-10-CM | POA: Diagnosis not present

## 2021-05-25 DIAGNOSIS — N186 End stage renal disease: Secondary | ICD-10-CM | POA: Diagnosis not present

## 2021-05-25 DIAGNOSIS — K625 Hemorrhage of anus and rectum: Secondary | ICD-10-CM | POA: Diagnosis not present

## 2021-05-25 DIAGNOSIS — R5381 Other malaise: Secondary | ICD-10-CM | POA: Diagnosis not present

## 2021-05-25 DIAGNOSIS — K5900 Constipation, unspecified: Secondary | ICD-10-CM | POA: Diagnosis not present

## 2021-05-25 DIAGNOSIS — I2510 Atherosclerotic heart disease of native coronary artery without angina pectoris: Secondary | ICD-10-CM | POA: Diagnosis not present

## 2021-05-25 DIAGNOSIS — K219 Gastro-esophageal reflux disease without esophagitis: Secondary | ICD-10-CM | POA: Diagnosis not present

## 2021-05-25 DIAGNOSIS — I10 Essential (primary) hypertension: Secondary | ICD-10-CM | POA: Diagnosis not present

## 2021-05-26 DIAGNOSIS — R011 Cardiac murmur, unspecified: Secondary | ICD-10-CM | POA: Diagnosis not present

## 2021-05-26 DIAGNOSIS — N186 End stage renal disease: Secondary | ICD-10-CM | POA: Diagnosis not present

## 2021-05-26 DIAGNOSIS — I2510 Atherosclerotic heart disease of native coronary artery without angina pectoris: Secondary | ICD-10-CM | POA: Diagnosis not present

## 2021-05-26 DIAGNOSIS — I120 Hypertensive chronic kidney disease with stage 5 chronic kidney disease or end stage renal disease: Secondary | ICD-10-CM | POA: Diagnosis not present

## 2021-05-26 DIAGNOSIS — D631 Anemia in chronic kidney disease: Secondary | ICD-10-CM | POA: Diagnosis not present

## 2021-05-26 DIAGNOSIS — E785 Hyperlipidemia, unspecified: Secondary | ICD-10-CM | POA: Diagnosis not present

## 2021-05-31 DIAGNOSIS — I120 Hypertensive chronic kidney disease with stage 5 chronic kidney disease or end stage renal disease: Secondary | ICD-10-CM | POA: Diagnosis not present

## 2021-05-31 DIAGNOSIS — D631 Anemia in chronic kidney disease: Secondary | ICD-10-CM | POA: Diagnosis not present

## 2021-05-31 DIAGNOSIS — I2510 Atherosclerotic heart disease of native coronary artery without angina pectoris: Secondary | ICD-10-CM | POA: Diagnosis not present

## 2021-05-31 DIAGNOSIS — N186 End stage renal disease: Secondary | ICD-10-CM | POA: Diagnosis not present

## 2021-05-31 DIAGNOSIS — E785 Hyperlipidemia, unspecified: Secondary | ICD-10-CM | POA: Diagnosis not present

## 2021-05-31 DIAGNOSIS — R011 Cardiac murmur, unspecified: Secondary | ICD-10-CM | POA: Diagnosis not present

## 2021-06-02 DIAGNOSIS — D631 Anemia in chronic kidney disease: Secondary | ICD-10-CM | POA: Diagnosis not present

## 2021-06-02 DIAGNOSIS — R011 Cardiac murmur, unspecified: Secondary | ICD-10-CM | POA: Diagnosis not present

## 2021-06-02 DIAGNOSIS — E785 Hyperlipidemia, unspecified: Secondary | ICD-10-CM | POA: Diagnosis not present

## 2021-06-02 DIAGNOSIS — N186 End stage renal disease: Secondary | ICD-10-CM | POA: Diagnosis not present

## 2021-06-02 DIAGNOSIS — I120 Hypertensive chronic kidney disease with stage 5 chronic kidney disease or end stage renal disease: Secondary | ICD-10-CM | POA: Diagnosis not present

## 2021-06-02 DIAGNOSIS — I2510 Atherosclerotic heart disease of native coronary artery without angina pectoris: Secondary | ICD-10-CM | POA: Diagnosis not present

## 2021-06-07 DIAGNOSIS — E785 Hyperlipidemia, unspecified: Secondary | ICD-10-CM | POA: Diagnosis not present

## 2021-06-07 DIAGNOSIS — D631 Anemia in chronic kidney disease: Secondary | ICD-10-CM | POA: Diagnosis not present

## 2021-06-07 DIAGNOSIS — I2510 Atherosclerotic heart disease of native coronary artery without angina pectoris: Secondary | ICD-10-CM | POA: Diagnosis not present

## 2021-06-07 DIAGNOSIS — N186 End stage renal disease: Secondary | ICD-10-CM | POA: Diagnosis not present

## 2021-06-07 DIAGNOSIS — R011 Cardiac murmur, unspecified: Secondary | ICD-10-CM | POA: Diagnosis not present

## 2021-06-07 DIAGNOSIS — I120 Hypertensive chronic kidney disease with stage 5 chronic kidney disease or end stage renal disease: Secondary | ICD-10-CM | POA: Diagnosis not present

## 2021-06-08 DIAGNOSIS — I120 Hypertensive chronic kidney disease with stage 5 chronic kidney disease or end stage renal disease: Secondary | ICD-10-CM | POA: Diagnosis not present

## 2021-06-08 DIAGNOSIS — E785 Hyperlipidemia, unspecified: Secondary | ICD-10-CM | POA: Diagnosis not present

## 2021-06-08 DIAGNOSIS — D631 Anemia in chronic kidney disease: Secondary | ICD-10-CM | POA: Diagnosis not present

## 2021-06-08 DIAGNOSIS — I2510 Atherosclerotic heart disease of native coronary artery without angina pectoris: Secondary | ICD-10-CM | POA: Diagnosis not present

## 2021-06-08 DIAGNOSIS — N186 End stage renal disease: Secondary | ICD-10-CM | POA: Diagnosis not present

## 2021-06-08 DIAGNOSIS — R011 Cardiac murmur, unspecified: Secondary | ICD-10-CM | POA: Diagnosis not present

## 2021-06-09 DIAGNOSIS — D631 Anemia in chronic kidney disease: Secondary | ICD-10-CM | POA: Diagnosis not present

## 2021-06-09 DIAGNOSIS — N186 End stage renal disease: Secondary | ICD-10-CM | POA: Diagnosis not present

## 2021-06-09 DIAGNOSIS — I120 Hypertensive chronic kidney disease with stage 5 chronic kidney disease or end stage renal disease: Secondary | ICD-10-CM | POA: Diagnosis not present

## 2021-06-09 DIAGNOSIS — R011 Cardiac murmur, unspecified: Secondary | ICD-10-CM | POA: Diagnosis not present

## 2021-06-09 DIAGNOSIS — E785 Hyperlipidemia, unspecified: Secondary | ICD-10-CM | POA: Diagnosis not present

## 2021-06-09 DIAGNOSIS — I2510 Atherosclerotic heart disease of native coronary artery without angina pectoris: Secondary | ICD-10-CM | POA: Diagnosis not present

## 2021-06-11 DIAGNOSIS — I2510 Atherosclerotic heart disease of native coronary artery without angina pectoris: Secondary | ICD-10-CM | POA: Diagnosis not present

## 2021-06-11 DIAGNOSIS — D631 Anemia in chronic kidney disease: Secondary | ICD-10-CM | POA: Diagnosis not present

## 2021-06-11 DIAGNOSIS — E785 Hyperlipidemia, unspecified: Secondary | ICD-10-CM | POA: Diagnosis not present

## 2021-06-11 DIAGNOSIS — N186 End stage renal disease: Secondary | ICD-10-CM | POA: Diagnosis not present

## 2021-06-11 DIAGNOSIS — R011 Cardiac murmur, unspecified: Secondary | ICD-10-CM | POA: Diagnosis not present

## 2021-06-11 DIAGNOSIS — I120 Hypertensive chronic kidney disease with stage 5 chronic kidney disease or end stage renal disease: Secondary | ICD-10-CM | POA: Diagnosis not present

## 2021-06-14 DIAGNOSIS — I2510 Atherosclerotic heart disease of native coronary artery without angina pectoris: Secondary | ICD-10-CM | POA: Diagnosis not present

## 2021-06-14 DIAGNOSIS — E785 Hyperlipidemia, unspecified: Secondary | ICD-10-CM | POA: Diagnosis not present

## 2021-06-14 DIAGNOSIS — N186 End stage renal disease: Secondary | ICD-10-CM | POA: Diagnosis not present

## 2021-06-14 DIAGNOSIS — I120 Hypertensive chronic kidney disease with stage 5 chronic kidney disease or end stage renal disease: Secondary | ICD-10-CM | POA: Diagnosis not present

## 2021-06-14 DIAGNOSIS — R011 Cardiac murmur, unspecified: Secondary | ICD-10-CM | POA: Diagnosis not present

## 2021-06-14 DIAGNOSIS — D631 Anemia in chronic kidney disease: Secondary | ICD-10-CM | POA: Diagnosis not present

## 2021-06-15 DIAGNOSIS — K5900 Constipation, unspecified: Secondary | ICD-10-CM | POA: Diagnosis not present

## 2021-06-15 DIAGNOSIS — K219 Gastro-esophageal reflux disease without esophagitis: Secondary | ICD-10-CM | POA: Diagnosis not present

## 2021-06-15 DIAGNOSIS — R195 Other fecal abnormalities: Secondary | ICD-10-CM | POA: Diagnosis not present

## 2021-06-15 DIAGNOSIS — I2510 Atherosclerotic heart disease of native coronary artery without angina pectoris: Secondary | ICD-10-CM | POA: Diagnosis not present

## 2021-06-15 DIAGNOSIS — N186 End stage renal disease: Secondary | ICD-10-CM | POA: Diagnosis not present

## 2021-06-15 DIAGNOSIS — R5381 Other malaise: Secondary | ICD-10-CM | POA: Diagnosis not present

## 2021-06-15 DIAGNOSIS — D649 Anemia, unspecified: Secondary | ICD-10-CM | POA: Diagnosis not present

## 2021-06-15 DIAGNOSIS — I10 Essential (primary) hypertension: Secondary | ICD-10-CM | POA: Diagnosis not present

## 2021-06-16 DIAGNOSIS — N186 End stage renal disease: Secondary | ICD-10-CM | POA: Diagnosis not present

## 2021-06-16 DIAGNOSIS — I2510 Atherosclerotic heart disease of native coronary artery without angina pectoris: Secondary | ICD-10-CM | POA: Diagnosis not present

## 2021-06-16 DIAGNOSIS — E785 Hyperlipidemia, unspecified: Secondary | ICD-10-CM | POA: Diagnosis not present

## 2021-06-16 DIAGNOSIS — D631 Anemia in chronic kidney disease: Secondary | ICD-10-CM | POA: Diagnosis not present

## 2021-06-16 DIAGNOSIS — I120 Hypertensive chronic kidney disease with stage 5 chronic kidney disease or end stage renal disease: Secondary | ICD-10-CM | POA: Diagnosis not present

## 2021-06-16 DIAGNOSIS — R011 Cardiac murmur, unspecified: Secondary | ICD-10-CM | POA: Diagnosis not present

## 2021-06-17 DIAGNOSIS — R011 Cardiac murmur, unspecified: Secondary | ICD-10-CM | POA: Diagnosis not present

## 2021-06-17 DIAGNOSIS — I120 Hypertensive chronic kidney disease with stage 5 chronic kidney disease or end stage renal disease: Secondary | ICD-10-CM | POA: Diagnosis not present

## 2021-06-17 DIAGNOSIS — N186 End stage renal disease: Secondary | ICD-10-CM | POA: Diagnosis not present

## 2021-06-17 DIAGNOSIS — D631 Anemia in chronic kidney disease: Secondary | ICD-10-CM | POA: Diagnosis not present

## 2021-06-17 DIAGNOSIS — I2510 Atherosclerotic heart disease of native coronary artery without angina pectoris: Secondary | ICD-10-CM | POA: Diagnosis not present

## 2021-06-17 DIAGNOSIS — E785 Hyperlipidemia, unspecified: Secondary | ICD-10-CM | POA: Diagnosis not present

## 2021-06-18 DIAGNOSIS — R011 Cardiac murmur, unspecified: Secondary | ICD-10-CM | POA: Diagnosis not present

## 2021-06-18 DIAGNOSIS — I2510 Atherosclerotic heart disease of native coronary artery without angina pectoris: Secondary | ICD-10-CM | POA: Diagnosis not present

## 2021-06-18 DIAGNOSIS — Z8042 Family history of malignant neoplasm of prostate: Secondary | ICD-10-CM | POA: Diagnosis not present

## 2021-06-18 DIAGNOSIS — D631 Anemia in chronic kidney disease: Secondary | ICD-10-CM | POA: Diagnosis not present

## 2021-06-18 DIAGNOSIS — L309 Dermatitis, unspecified: Secondary | ICD-10-CM | POA: Diagnosis not present

## 2021-06-18 DIAGNOSIS — K5900 Constipation, unspecified: Secondary | ICD-10-CM | POA: Diagnosis not present

## 2021-06-18 DIAGNOSIS — E785 Hyperlipidemia, unspecified: Secondary | ICD-10-CM | POA: Diagnosis not present

## 2021-06-18 DIAGNOSIS — N186 End stage renal disease: Secondary | ICD-10-CM | POA: Diagnosis not present

## 2021-06-18 DIAGNOSIS — I252 Old myocardial infarction: Secondary | ICD-10-CM | POA: Diagnosis not present

## 2021-06-18 DIAGNOSIS — S42301S Unspecified fracture of shaft of humerus, right arm, sequela: Secondary | ICD-10-CM | POA: Diagnosis not present

## 2021-06-18 DIAGNOSIS — K219 Gastro-esophageal reflux disease without esophagitis: Secondary | ICD-10-CM | POA: Diagnosis not present

## 2021-06-18 DIAGNOSIS — Z681 Body mass index (BMI) 19 or less, adult: Secondary | ICD-10-CM | POA: Diagnosis not present

## 2021-06-18 DIAGNOSIS — R634 Abnormal weight loss: Secondary | ICD-10-CM | POA: Diagnosis not present

## 2021-06-18 DIAGNOSIS — Z9181 History of falling: Secondary | ICD-10-CM | POA: Diagnosis not present

## 2021-06-18 DIAGNOSIS — I120 Hypertensive chronic kidney disease with stage 5 chronic kidney disease or end stage renal disease: Secondary | ICD-10-CM | POA: Diagnosis not present

## 2021-06-21 DIAGNOSIS — E785 Hyperlipidemia, unspecified: Secondary | ICD-10-CM | POA: Diagnosis not present

## 2021-06-21 DIAGNOSIS — I2510 Atherosclerotic heart disease of native coronary artery without angina pectoris: Secondary | ICD-10-CM | POA: Diagnosis not present

## 2021-06-21 DIAGNOSIS — R011 Cardiac murmur, unspecified: Secondary | ICD-10-CM | POA: Diagnosis not present

## 2021-06-21 DIAGNOSIS — N186 End stage renal disease: Secondary | ICD-10-CM | POA: Diagnosis not present

## 2021-06-21 DIAGNOSIS — I120 Hypertensive chronic kidney disease with stage 5 chronic kidney disease or end stage renal disease: Secondary | ICD-10-CM | POA: Diagnosis not present

## 2021-06-21 DIAGNOSIS — D631 Anemia in chronic kidney disease: Secondary | ICD-10-CM | POA: Diagnosis not present

## 2021-06-22 DIAGNOSIS — E46 Unspecified protein-calorie malnutrition: Secondary | ICD-10-CM | POA: Diagnosis not present

## 2021-06-22 DIAGNOSIS — D649 Anemia, unspecified: Secondary | ICD-10-CM | POA: Diagnosis not present

## 2021-06-22 DIAGNOSIS — K219 Gastro-esophageal reflux disease without esophagitis: Secondary | ICD-10-CM | POA: Diagnosis not present

## 2021-06-22 DIAGNOSIS — K5900 Constipation, unspecified: Secondary | ICD-10-CM | POA: Diagnosis not present

## 2021-06-22 DIAGNOSIS — I2510 Atherosclerotic heart disease of native coronary artery without angina pectoris: Secondary | ICD-10-CM | POA: Diagnosis not present

## 2021-06-22 DIAGNOSIS — N186 End stage renal disease: Secondary | ICD-10-CM | POA: Diagnosis not present

## 2021-06-23 DIAGNOSIS — D631 Anemia in chronic kidney disease: Secondary | ICD-10-CM | POA: Diagnosis not present

## 2021-06-23 DIAGNOSIS — I10 Essential (primary) hypertension: Secondary | ICD-10-CM | POA: Diagnosis not present

## 2021-06-23 DIAGNOSIS — K5900 Constipation, unspecified: Secondary | ICD-10-CM | POA: Diagnosis not present

## 2021-06-23 DIAGNOSIS — I2510 Atherosclerotic heart disease of native coronary artery without angina pectoris: Secondary | ICD-10-CM | POA: Diagnosis not present

## 2021-06-23 DIAGNOSIS — D649 Anemia, unspecified: Secondary | ICD-10-CM | POA: Diagnosis not present

## 2021-06-23 DIAGNOSIS — I120 Hypertensive chronic kidney disease with stage 5 chronic kidney disease or end stage renal disease: Secondary | ICD-10-CM | POA: Diagnosis not present

## 2021-06-23 DIAGNOSIS — R5381 Other malaise: Secondary | ICD-10-CM | POA: Diagnosis not present

## 2021-06-23 DIAGNOSIS — R011 Cardiac murmur, unspecified: Secondary | ICD-10-CM | POA: Diagnosis not present

## 2021-06-23 DIAGNOSIS — E46 Unspecified protein-calorie malnutrition: Secondary | ICD-10-CM | POA: Diagnosis not present

## 2021-06-23 DIAGNOSIS — E785 Hyperlipidemia, unspecified: Secondary | ICD-10-CM | POA: Diagnosis not present

## 2021-06-23 DIAGNOSIS — K219 Gastro-esophageal reflux disease without esophagitis: Secondary | ICD-10-CM | POA: Diagnosis not present

## 2021-06-23 DIAGNOSIS — N186 End stage renal disease: Secondary | ICD-10-CM | POA: Diagnosis not present

## 2021-06-28 DIAGNOSIS — N186 End stage renal disease: Secondary | ICD-10-CM | POA: Diagnosis not present

## 2021-06-28 DIAGNOSIS — R011 Cardiac murmur, unspecified: Secondary | ICD-10-CM | POA: Diagnosis not present

## 2021-06-28 DIAGNOSIS — I120 Hypertensive chronic kidney disease with stage 5 chronic kidney disease or end stage renal disease: Secondary | ICD-10-CM | POA: Diagnosis not present

## 2021-06-28 DIAGNOSIS — D631 Anemia in chronic kidney disease: Secondary | ICD-10-CM | POA: Diagnosis not present

## 2021-06-28 DIAGNOSIS — E785 Hyperlipidemia, unspecified: Secondary | ICD-10-CM | POA: Diagnosis not present

## 2021-06-28 DIAGNOSIS — I2510 Atherosclerotic heart disease of native coronary artery without angina pectoris: Secondary | ICD-10-CM | POA: Diagnosis not present

## 2021-06-30 DIAGNOSIS — R011 Cardiac murmur, unspecified: Secondary | ICD-10-CM | POA: Diagnosis not present

## 2021-06-30 DIAGNOSIS — D631 Anemia in chronic kidney disease: Secondary | ICD-10-CM | POA: Diagnosis not present

## 2021-06-30 DIAGNOSIS — E785 Hyperlipidemia, unspecified: Secondary | ICD-10-CM | POA: Diagnosis not present

## 2021-06-30 DIAGNOSIS — N186 End stage renal disease: Secondary | ICD-10-CM | POA: Diagnosis not present

## 2021-06-30 DIAGNOSIS — I2510 Atherosclerotic heart disease of native coronary artery without angina pectoris: Secondary | ICD-10-CM | POA: Diagnosis not present

## 2021-06-30 DIAGNOSIS — I120 Hypertensive chronic kidney disease with stage 5 chronic kidney disease or end stage renal disease: Secondary | ICD-10-CM | POA: Diagnosis not present

## 2021-07-01 DIAGNOSIS — N186 End stage renal disease: Secondary | ICD-10-CM | POA: Diagnosis not present

## 2021-07-01 DIAGNOSIS — I120 Hypertensive chronic kidney disease with stage 5 chronic kidney disease or end stage renal disease: Secondary | ICD-10-CM | POA: Diagnosis not present

## 2021-07-01 DIAGNOSIS — I2510 Atherosclerotic heart disease of native coronary artery without angina pectoris: Secondary | ICD-10-CM | POA: Diagnosis not present

## 2021-07-01 DIAGNOSIS — R011 Cardiac murmur, unspecified: Secondary | ICD-10-CM | POA: Diagnosis not present

## 2021-07-01 DIAGNOSIS — E785 Hyperlipidemia, unspecified: Secondary | ICD-10-CM | POA: Diagnosis not present

## 2021-07-01 DIAGNOSIS — D631 Anemia in chronic kidney disease: Secondary | ICD-10-CM | POA: Diagnosis not present

## 2021-07-05 DIAGNOSIS — I2510 Atherosclerotic heart disease of native coronary artery without angina pectoris: Secondary | ICD-10-CM | POA: Diagnosis not present

## 2021-07-05 DIAGNOSIS — E785 Hyperlipidemia, unspecified: Secondary | ICD-10-CM | POA: Diagnosis not present

## 2021-07-05 DIAGNOSIS — D631 Anemia in chronic kidney disease: Secondary | ICD-10-CM | POA: Diagnosis not present

## 2021-07-05 DIAGNOSIS — I120 Hypertensive chronic kidney disease with stage 5 chronic kidney disease or end stage renal disease: Secondary | ICD-10-CM | POA: Diagnosis not present

## 2021-07-05 DIAGNOSIS — R011 Cardiac murmur, unspecified: Secondary | ICD-10-CM | POA: Diagnosis not present

## 2021-07-05 DIAGNOSIS — N186 End stage renal disease: Secondary | ICD-10-CM | POA: Diagnosis not present

## 2021-07-07 DIAGNOSIS — I120 Hypertensive chronic kidney disease with stage 5 chronic kidney disease or end stage renal disease: Secondary | ICD-10-CM | POA: Diagnosis not present

## 2021-07-07 DIAGNOSIS — R011 Cardiac murmur, unspecified: Secondary | ICD-10-CM | POA: Diagnosis not present

## 2021-07-07 DIAGNOSIS — I2510 Atherosclerotic heart disease of native coronary artery without angina pectoris: Secondary | ICD-10-CM | POA: Diagnosis not present

## 2021-07-07 DIAGNOSIS — E785 Hyperlipidemia, unspecified: Secondary | ICD-10-CM | POA: Diagnosis not present

## 2021-07-07 DIAGNOSIS — N186 End stage renal disease: Secondary | ICD-10-CM | POA: Diagnosis not present

## 2021-07-07 DIAGNOSIS — D631 Anemia in chronic kidney disease: Secondary | ICD-10-CM | POA: Diagnosis not present

## 2021-07-11 DIAGNOSIS — D631 Anemia in chronic kidney disease: Secondary | ICD-10-CM | POA: Diagnosis not present

## 2021-07-11 DIAGNOSIS — I2510 Atherosclerotic heart disease of native coronary artery without angina pectoris: Secondary | ICD-10-CM | POA: Diagnosis not present

## 2021-07-11 DIAGNOSIS — N186 End stage renal disease: Secondary | ICD-10-CM | POA: Diagnosis not present

## 2021-07-11 DIAGNOSIS — I120 Hypertensive chronic kidney disease with stage 5 chronic kidney disease or end stage renal disease: Secondary | ICD-10-CM | POA: Diagnosis not present

## 2021-07-11 DIAGNOSIS — E785 Hyperlipidemia, unspecified: Secondary | ICD-10-CM | POA: Diagnosis not present

## 2021-07-11 DIAGNOSIS — R011 Cardiac murmur, unspecified: Secondary | ICD-10-CM | POA: Diagnosis not present

## 2021-07-12 DIAGNOSIS — D631 Anemia in chronic kidney disease: Secondary | ICD-10-CM | POA: Diagnosis not present

## 2021-07-12 DIAGNOSIS — E785 Hyperlipidemia, unspecified: Secondary | ICD-10-CM | POA: Diagnosis not present

## 2021-07-12 DIAGNOSIS — I2510 Atherosclerotic heart disease of native coronary artery without angina pectoris: Secondary | ICD-10-CM | POA: Diagnosis not present

## 2021-07-12 DIAGNOSIS — I120 Hypertensive chronic kidney disease with stage 5 chronic kidney disease or end stage renal disease: Secondary | ICD-10-CM | POA: Diagnosis not present

## 2021-07-12 DIAGNOSIS — R011 Cardiac murmur, unspecified: Secondary | ICD-10-CM | POA: Diagnosis not present

## 2021-07-12 DIAGNOSIS — N186 End stage renal disease: Secondary | ICD-10-CM | POA: Diagnosis not present

## 2021-07-14 DIAGNOSIS — I2510 Atherosclerotic heart disease of native coronary artery without angina pectoris: Secondary | ICD-10-CM | POA: Diagnosis not present

## 2021-07-14 DIAGNOSIS — E785 Hyperlipidemia, unspecified: Secondary | ICD-10-CM | POA: Diagnosis not present

## 2021-07-14 DIAGNOSIS — D631 Anemia in chronic kidney disease: Secondary | ICD-10-CM | POA: Diagnosis not present

## 2021-07-14 DIAGNOSIS — I120 Hypertensive chronic kidney disease with stage 5 chronic kidney disease or end stage renal disease: Secondary | ICD-10-CM | POA: Diagnosis not present

## 2021-07-14 DIAGNOSIS — R011 Cardiac murmur, unspecified: Secondary | ICD-10-CM | POA: Diagnosis not present

## 2021-07-14 DIAGNOSIS — N186 End stage renal disease: Secondary | ICD-10-CM | POA: Diagnosis not present

## 2021-07-18 DIAGNOSIS — S42301S Unspecified fracture of shaft of humerus, right arm, sequela: Secondary | ICD-10-CM | POA: Diagnosis not present

## 2021-07-18 DIAGNOSIS — K219 Gastro-esophageal reflux disease without esophagitis: Secondary | ICD-10-CM | POA: Diagnosis not present

## 2021-07-18 DIAGNOSIS — E785 Hyperlipidemia, unspecified: Secondary | ICD-10-CM | POA: Diagnosis not present

## 2021-07-18 DIAGNOSIS — R634 Abnormal weight loss: Secondary | ICD-10-CM | POA: Diagnosis not present

## 2021-07-18 DIAGNOSIS — N186 End stage renal disease: Secondary | ICD-10-CM | POA: Diagnosis not present

## 2021-07-18 DIAGNOSIS — I252 Old myocardial infarction: Secondary | ICD-10-CM | POA: Diagnosis not present

## 2021-07-18 DIAGNOSIS — L309 Dermatitis, unspecified: Secondary | ICD-10-CM | POA: Diagnosis not present

## 2021-07-18 DIAGNOSIS — I120 Hypertensive chronic kidney disease with stage 5 chronic kidney disease or end stage renal disease: Secondary | ICD-10-CM | POA: Diagnosis not present

## 2021-07-18 DIAGNOSIS — I2510 Atherosclerotic heart disease of native coronary artery without angina pectoris: Secondary | ICD-10-CM | POA: Diagnosis not present

## 2021-07-18 DIAGNOSIS — Z8042 Family history of malignant neoplasm of prostate: Secondary | ICD-10-CM | POA: Diagnosis not present

## 2021-07-18 DIAGNOSIS — D631 Anemia in chronic kidney disease: Secondary | ICD-10-CM | POA: Diagnosis not present

## 2021-07-18 DIAGNOSIS — K5900 Constipation, unspecified: Secondary | ICD-10-CM | POA: Diagnosis not present

## 2021-07-18 DIAGNOSIS — Z681 Body mass index (BMI) 19 or less, adult: Secondary | ICD-10-CM | POA: Diagnosis not present

## 2021-07-18 DIAGNOSIS — Z9181 History of falling: Secondary | ICD-10-CM | POA: Diagnosis not present

## 2021-07-18 DIAGNOSIS — R011 Cardiac murmur, unspecified: Secondary | ICD-10-CM | POA: Diagnosis not present

## 2021-07-19 DIAGNOSIS — R011 Cardiac murmur, unspecified: Secondary | ICD-10-CM | POA: Diagnosis not present

## 2021-07-19 DIAGNOSIS — I120 Hypertensive chronic kidney disease with stage 5 chronic kidney disease or end stage renal disease: Secondary | ICD-10-CM | POA: Diagnosis not present

## 2021-07-19 DIAGNOSIS — D631 Anemia in chronic kidney disease: Secondary | ICD-10-CM | POA: Diagnosis not present

## 2021-07-19 DIAGNOSIS — N186 End stage renal disease: Secondary | ICD-10-CM | POA: Diagnosis not present

## 2021-07-19 DIAGNOSIS — E785 Hyperlipidemia, unspecified: Secondary | ICD-10-CM | POA: Diagnosis not present

## 2021-07-19 DIAGNOSIS — I2510 Atherosclerotic heart disease of native coronary artery without angina pectoris: Secondary | ICD-10-CM | POA: Diagnosis not present

## 2021-07-20 DIAGNOSIS — D649 Anemia, unspecified: Secondary | ICD-10-CM | POA: Diagnosis not present

## 2021-07-20 DIAGNOSIS — E46 Unspecified protein-calorie malnutrition: Secondary | ICD-10-CM | POA: Diagnosis not present

## 2021-07-20 DIAGNOSIS — K5900 Constipation, unspecified: Secondary | ICD-10-CM | POA: Diagnosis not present

## 2021-07-20 DIAGNOSIS — K219 Gastro-esophageal reflux disease without esophagitis: Secondary | ICD-10-CM | POA: Diagnosis not present

## 2021-07-20 DIAGNOSIS — I10 Essential (primary) hypertension: Secondary | ICD-10-CM | POA: Diagnosis not present

## 2021-07-20 DIAGNOSIS — N186 End stage renal disease: Secondary | ICD-10-CM | POA: Diagnosis not present

## 2021-07-20 DIAGNOSIS — R5381 Other malaise: Secondary | ICD-10-CM | POA: Diagnosis not present

## 2021-07-20 DIAGNOSIS — I2510 Atherosclerotic heart disease of native coronary artery without angina pectoris: Secondary | ICD-10-CM | POA: Diagnosis not present

## 2021-07-21 DIAGNOSIS — E785 Hyperlipidemia, unspecified: Secondary | ICD-10-CM | POA: Diagnosis not present

## 2021-07-21 DIAGNOSIS — D631 Anemia in chronic kidney disease: Secondary | ICD-10-CM | POA: Diagnosis not present

## 2021-07-21 DIAGNOSIS — I120 Hypertensive chronic kidney disease with stage 5 chronic kidney disease or end stage renal disease: Secondary | ICD-10-CM | POA: Diagnosis not present

## 2021-07-21 DIAGNOSIS — N186 End stage renal disease: Secondary | ICD-10-CM | POA: Diagnosis not present

## 2021-07-21 DIAGNOSIS — I2510 Atherosclerotic heart disease of native coronary artery without angina pectoris: Secondary | ICD-10-CM | POA: Diagnosis not present

## 2021-07-21 DIAGNOSIS — R011 Cardiac murmur, unspecified: Secondary | ICD-10-CM | POA: Diagnosis not present

## 2021-07-26 DIAGNOSIS — I2510 Atherosclerotic heart disease of native coronary artery without angina pectoris: Secondary | ICD-10-CM | POA: Diagnosis not present

## 2021-07-26 DIAGNOSIS — R011 Cardiac murmur, unspecified: Secondary | ICD-10-CM | POA: Diagnosis not present

## 2021-07-26 DIAGNOSIS — N186 End stage renal disease: Secondary | ICD-10-CM | POA: Diagnosis not present

## 2021-07-26 DIAGNOSIS — D631 Anemia in chronic kidney disease: Secondary | ICD-10-CM | POA: Diagnosis not present

## 2021-07-26 DIAGNOSIS — E785 Hyperlipidemia, unspecified: Secondary | ICD-10-CM | POA: Diagnosis not present

## 2021-07-26 DIAGNOSIS — I120 Hypertensive chronic kidney disease with stage 5 chronic kidney disease or end stage renal disease: Secondary | ICD-10-CM | POA: Diagnosis not present

## 2021-07-28 DIAGNOSIS — D631 Anemia in chronic kidney disease: Secondary | ICD-10-CM | POA: Diagnosis not present

## 2021-07-28 DIAGNOSIS — I2510 Atherosclerotic heart disease of native coronary artery without angina pectoris: Secondary | ICD-10-CM | POA: Diagnosis not present

## 2021-07-28 DIAGNOSIS — E46 Unspecified protein-calorie malnutrition: Secondary | ICD-10-CM | POA: Diagnosis not present

## 2021-07-28 DIAGNOSIS — R5381 Other malaise: Secondary | ICD-10-CM | POA: Diagnosis not present

## 2021-07-28 DIAGNOSIS — R011 Cardiac murmur, unspecified: Secondary | ICD-10-CM | POA: Diagnosis not present

## 2021-07-28 DIAGNOSIS — E785 Hyperlipidemia, unspecified: Secondary | ICD-10-CM | POA: Diagnosis not present

## 2021-07-28 DIAGNOSIS — K219 Gastro-esophageal reflux disease without esophagitis: Secondary | ICD-10-CM | POA: Diagnosis not present

## 2021-07-28 DIAGNOSIS — D649 Anemia, unspecified: Secondary | ICD-10-CM | POA: Diagnosis not present

## 2021-07-28 DIAGNOSIS — K5900 Constipation, unspecified: Secondary | ICD-10-CM | POA: Diagnosis not present

## 2021-07-28 DIAGNOSIS — N186 End stage renal disease: Secondary | ICD-10-CM | POA: Diagnosis not present

## 2021-07-28 DIAGNOSIS — I120 Hypertensive chronic kidney disease with stage 5 chronic kidney disease or end stage renal disease: Secondary | ICD-10-CM | POA: Diagnosis not present

## 2021-07-29 DIAGNOSIS — N186 End stage renal disease: Secondary | ICD-10-CM | POA: Diagnosis not present

## 2021-07-29 DIAGNOSIS — I2510 Atherosclerotic heart disease of native coronary artery without angina pectoris: Secondary | ICD-10-CM | POA: Diagnosis not present

## 2021-07-29 DIAGNOSIS — D631 Anemia in chronic kidney disease: Secondary | ICD-10-CM | POA: Diagnosis not present

## 2021-07-29 DIAGNOSIS — R011 Cardiac murmur, unspecified: Secondary | ICD-10-CM | POA: Diagnosis not present

## 2021-07-29 DIAGNOSIS — E785 Hyperlipidemia, unspecified: Secondary | ICD-10-CM | POA: Diagnosis not present

## 2021-07-29 DIAGNOSIS — I120 Hypertensive chronic kidney disease with stage 5 chronic kidney disease or end stage renal disease: Secondary | ICD-10-CM | POA: Diagnosis not present

## 2021-08-02 DIAGNOSIS — E785 Hyperlipidemia, unspecified: Secondary | ICD-10-CM | POA: Diagnosis not present

## 2021-08-02 DIAGNOSIS — D631 Anemia in chronic kidney disease: Secondary | ICD-10-CM | POA: Diagnosis not present

## 2021-08-02 DIAGNOSIS — I120 Hypertensive chronic kidney disease with stage 5 chronic kidney disease or end stage renal disease: Secondary | ICD-10-CM | POA: Diagnosis not present

## 2021-08-02 DIAGNOSIS — I2510 Atherosclerotic heart disease of native coronary artery without angina pectoris: Secondary | ICD-10-CM | POA: Diagnosis not present

## 2021-08-02 DIAGNOSIS — R011 Cardiac murmur, unspecified: Secondary | ICD-10-CM | POA: Diagnosis not present

## 2021-08-02 DIAGNOSIS — N186 End stage renal disease: Secondary | ICD-10-CM | POA: Diagnosis not present

## 2021-08-04 DIAGNOSIS — N186 End stage renal disease: Secondary | ICD-10-CM | POA: Diagnosis not present

## 2021-08-04 DIAGNOSIS — I120 Hypertensive chronic kidney disease with stage 5 chronic kidney disease or end stage renal disease: Secondary | ICD-10-CM | POA: Diagnosis not present

## 2021-08-04 DIAGNOSIS — D631 Anemia in chronic kidney disease: Secondary | ICD-10-CM | POA: Diagnosis not present

## 2021-08-04 DIAGNOSIS — I2510 Atherosclerotic heart disease of native coronary artery without angina pectoris: Secondary | ICD-10-CM | POA: Diagnosis not present

## 2021-08-04 DIAGNOSIS — R011 Cardiac murmur, unspecified: Secondary | ICD-10-CM | POA: Diagnosis not present

## 2021-08-04 DIAGNOSIS — E785 Hyperlipidemia, unspecified: Secondary | ICD-10-CM | POA: Diagnosis not present

## 2021-08-05 DIAGNOSIS — N186 End stage renal disease: Secondary | ICD-10-CM | POA: Diagnosis not present

## 2021-08-05 DIAGNOSIS — I2510 Atherosclerotic heart disease of native coronary artery without angina pectoris: Secondary | ICD-10-CM | POA: Diagnosis not present

## 2021-08-05 DIAGNOSIS — D631 Anemia in chronic kidney disease: Secondary | ICD-10-CM | POA: Diagnosis not present

## 2021-08-05 DIAGNOSIS — E785 Hyperlipidemia, unspecified: Secondary | ICD-10-CM | POA: Diagnosis not present

## 2021-08-05 DIAGNOSIS — R011 Cardiac murmur, unspecified: Secondary | ICD-10-CM | POA: Diagnosis not present

## 2021-08-05 DIAGNOSIS — I120 Hypertensive chronic kidney disease with stage 5 chronic kidney disease or end stage renal disease: Secondary | ICD-10-CM | POA: Diagnosis not present

## 2021-08-06 DIAGNOSIS — I2510 Atherosclerotic heart disease of native coronary artery without angina pectoris: Secondary | ICD-10-CM | POA: Diagnosis not present

## 2021-08-06 DIAGNOSIS — I120 Hypertensive chronic kidney disease with stage 5 chronic kidney disease or end stage renal disease: Secondary | ICD-10-CM | POA: Diagnosis not present

## 2021-08-06 DIAGNOSIS — N186 End stage renal disease: Secondary | ICD-10-CM | POA: Diagnosis not present

## 2021-08-06 DIAGNOSIS — D631 Anemia in chronic kidney disease: Secondary | ICD-10-CM | POA: Diagnosis not present

## 2021-08-06 DIAGNOSIS — E785 Hyperlipidemia, unspecified: Secondary | ICD-10-CM | POA: Diagnosis not present

## 2021-08-06 DIAGNOSIS — R011 Cardiac murmur, unspecified: Secondary | ICD-10-CM | POA: Diagnosis not present

## 2021-08-08 DIAGNOSIS — E46 Unspecified protein-calorie malnutrition: Secondary | ICD-10-CM | POA: Diagnosis not present

## 2021-08-08 DIAGNOSIS — K5900 Constipation, unspecified: Secondary | ICD-10-CM | POA: Diagnosis not present

## 2021-08-08 DIAGNOSIS — D649 Anemia, unspecified: Secondary | ICD-10-CM | POA: Diagnosis not present

## 2021-08-08 DIAGNOSIS — I10 Essential (primary) hypertension: Secondary | ICD-10-CM | POA: Diagnosis not present

## 2021-08-08 DIAGNOSIS — L89202 Pressure ulcer of unspecified hip, stage 2: Secondary | ICD-10-CM | POA: Diagnosis not present

## 2021-08-08 DIAGNOSIS — I2510 Atherosclerotic heart disease of native coronary artery without angina pectoris: Secondary | ICD-10-CM | POA: Diagnosis not present

## 2021-08-08 DIAGNOSIS — R5381 Other malaise: Secondary | ICD-10-CM | POA: Diagnosis not present

## 2021-08-08 DIAGNOSIS — N186 End stage renal disease: Secondary | ICD-10-CM | POA: Diagnosis not present

## 2021-08-08 DIAGNOSIS — K219 Gastro-esophageal reflux disease without esophagitis: Secondary | ICD-10-CM | POA: Diagnosis not present

## 2021-08-09 DIAGNOSIS — D631 Anemia in chronic kidney disease: Secondary | ICD-10-CM | POA: Diagnosis not present

## 2021-08-09 DIAGNOSIS — I120 Hypertensive chronic kidney disease with stage 5 chronic kidney disease or end stage renal disease: Secondary | ICD-10-CM | POA: Diagnosis not present

## 2021-08-09 DIAGNOSIS — N186 End stage renal disease: Secondary | ICD-10-CM | POA: Diagnosis not present

## 2021-08-09 DIAGNOSIS — I2510 Atherosclerotic heart disease of native coronary artery without angina pectoris: Secondary | ICD-10-CM | POA: Diagnosis not present

## 2021-08-09 DIAGNOSIS — R011 Cardiac murmur, unspecified: Secondary | ICD-10-CM | POA: Diagnosis not present

## 2021-08-09 DIAGNOSIS — E785 Hyperlipidemia, unspecified: Secondary | ICD-10-CM | POA: Diagnosis not present

## 2021-08-10 DIAGNOSIS — N186 End stage renal disease: Secondary | ICD-10-CM | POA: Diagnosis not present

## 2021-08-10 DIAGNOSIS — I2510 Atherosclerotic heart disease of native coronary artery without angina pectoris: Secondary | ICD-10-CM | POA: Diagnosis not present

## 2021-08-10 DIAGNOSIS — R011 Cardiac murmur, unspecified: Secondary | ICD-10-CM | POA: Diagnosis not present

## 2021-08-10 DIAGNOSIS — E785 Hyperlipidemia, unspecified: Secondary | ICD-10-CM | POA: Diagnosis not present

## 2021-08-10 DIAGNOSIS — I120 Hypertensive chronic kidney disease with stage 5 chronic kidney disease or end stage renal disease: Secondary | ICD-10-CM | POA: Diagnosis not present

## 2021-08-10 DIAGNOSIS — D631 Anemia in chronic kidney disease: Secondary | ICD-10-CM | POA: Diagnosis not present

## 2021-08-11 DIAGNOSIS — I120 Hypertensive chronic kidney disease with stage 5 chronic kidney disease or end stage renal disease: Secondary | ICD-10-CM | POA: Diagnosis not present

## 2021-08-11 DIAGNOSIS — N186 End stage renal disease: Secondary | ICD-10-CM | POA: Diagnosis not present

## 2021-08-11 DIAGNOSIS — D631 Anemia in chronic kidney disease: Secondary | ICD-10-CM | POA: Diagnosis not present

## 2021-08-11 DIAGNOSIS — R011 Cardiac murmur, unspecified: Secondary | ICD-10-CM | POA: Diagnosis not present

## 2021-08-11 DIAGNOSIS — E785 Hyperlipidemia, unspecified: Secondary | ICD-10-CM | POA: Diagnosis not present

## 2021-08-11 DIAGNOSIS — I2510 Atherosclerotic heart disease of native coronary artery without angina pectoris: Secondary | ICD-10-CM | POA: Diagnosis not present

## 2021-08-16 DIAGNOSIS — R011 Cardiac murmur, unspecified: Secondary | ICD-10-CM | POA: Diagnosis not present

## 2021-08-16 DIAGNOSIS — E785 Hyperlipidemia, unspecified: Secondary | ICD-10-CM | POA: Diagnosis not present

## 2021-08-16 DIAGNOSIS — I2510 Atherosclerotic heart disease of native coronary artery without angina pectoris: Secondary | ICD-10-CM | POA: Diagnosis not present

## 2021-08-16 DIAGNOSIS — D631 Anemia in chronic kidney disease: Secondary | ICD-10-CM | POA: Diagnosis not present

## 2021-08-16 DIAGNOSIS — N186 End stage renal disease: Secondary | ICD-10-CM | POA: Diagnosis not present

## 2021-08-16 DIAGNOSIS — I120 Hypertensive chronic kidney disease with stage 5 chronic kidney disease or end stage renal disease: Secondary | ICD-10-CM | POA: Diagnosis not present

## 2021-08-17 DIAGNOSIS — I2510 Atherosclerotic heart disease of native coronary artery without angina pectoris: Secondary | ICD-10-CM | POA: Diagnosis not present

## 2021-08-17 DIAGNOSIS — R011 Cardiac murmur, unspecified: Secondary | ICD-10-CM | POA: Diagnosis not present

## 2021-08-17 DIAGNOSIS — N186 End stage renal disease: Secondary | ICD-10-CM | POA: Diagnosis not present

## 2021-08-17 DIAGNOSIS — I120 Hypertensive chronic kidney disease with stage 5 chronic kidney disease or end stage renal disease: Secondary | ICD-10-CM | POA: Diagnosis not present

## 2021-08-17 DIAGNOSIS — E785 Hyperlipidemia, unspecified: Secondary | ICD-10-CM | POA: Diagnosis not present

## 2021-08-17 DIAGNOSIS — D631 Anemia in chronic kidney disease: Secondary | ICD-10-CM | POA: Diagnosis not present

## 2021-08-18 DIAGNOSIS — S42301S Unspecified fracture of shaft of humerus, right arm, sequela: Secondary | ICD-10-CM | POA: Diagnosis not present

## 2021-08-18 DIAGNOSIS — D631 Anemia in chronic kidney disease: Secondary | ICD-10-CM | POA: Diagnosis not present

## 2021-08-18 DIAGNOSIS — Z9181 History of falling: Secondary | ICD-10-CM | POA: Diagnosis not present

## 2021-08-18 DIAGNOSIS — R011 Cardiac murmur, unspecified: Secondary | ICD-10-CM | POA: Diagnosis not present

## 2021-08-18 DIAGNOSIS — L309 Dermatitis, unspecified: Secondary | ICD-10-CM | POA: Diagnosis not present

## 2021-08-18 DIAGNOSIS — I252 Old myocardial infarction: Secondary | ICD-10-CM | POA: Diagnosis not present

## 2021-08-18 DIAGNOSIS — N186 End stage renal disease: Secondary | ICD-10-CM | POA: Diagnosis not present

## 2021-08-18 DIAGNOSIS — I120 Hypertensive chronic kidney disease with stage 5 chronic kidney disease or end stage renal disease: Secondary | ICD-10-CM | POA: Diagnosis not present

## 2021-08-18 DIAGNOSIS — Z681 Body mass index (BMI) 19 or less, adult: Secondary | ICD-10-CM | POA: Diagnosis not present

## 2021-08-18 DIAGNOSIS — K5900 Constipation, unspecified: Secondary | ICD-10-CM | POA: Diagnosis not present

## 2021-08-18 DIAGNOSIS — R634 Abnormal weight loss: Secondary | ICD-10-CM | POA: Diagnosis not present

## 2021-08-18 DIAGNOSIS — I2510 Atherosclerotic heart disease of native coronary artery without angina pectoris: Secondary | ICD-10-CM | POA: Diagnosis not present

## 2021-08-18 DIAGNOSIS — K219 Gastro-esophageal reflux disease without esophagitis: Secondary | ICD-10-CM | POA: Diagnosis not present

## 2021-08-18 DIAGNOSIS — E785 Hyperlipidemia, unspecified: Secondary | ICD-10-CM | POA: Diagnosis not present

## 2021-08-18 DIAGNOSIS — Z8042 Family history of malignant neoplasm of prostate: Secondary | ICD-10-CM | POA: Diagnosis not present

## 2021-08-23 DIAGNOSIS — R011 Cardiac murmur, unspecified: Secondary | ICD-10-CM | POA: Diagnosis not present

## 2021-08-23 DIAGNOSIS — I2510 Atherosclerotic heart disease of native coronary artery without angina pectoris: Secondary | ICD-10-CM | POA: Diagnosis not present

## 2021-08-23 DIAGNOSIS — D631 Anemia in chronic kidney disease: Secondary | ICD-10-CM | POA: Diagnosis not present

## 2021-08-23 DIAGNOSIS — N186 End stage renal disease: Secondary | ICD-10-CM | POA: Diagnosis not present

## 2021-08-23 DIAGNOSIS — I120 Hypertensive chronic kidney disease with stage 5 chronic kidney disease or end stage renal disease: Secondary | ICD-10-CM | POA: Diagnosis not present

## 2021-08-23 DIAGNOSIS — E785 Hyperlipidemia, unspecified: Secondary | ICD-10-CM | POA: Diagnosis not present

## 2021-08-24 DIAGNOSIS — E785 Hyperlipidemia, unspecified: Secondary | ICD-10-CM | POA: Diagnosis not present

## 2021-08-24 DIAGNOSIS — I120 Hypertensive chronic kidney disease with stage 5 chronic kidney disease or end stage renal disease: Secondary | ICD-10-CM | POA: Diagnosis not present

## 2021-08-24 DIAGNOSIS — I2510 Atherosclerotic heart disease of native coronary artery without angina pectoris: Secondary | ICD-10-CM | POA: Diagnosis not present

## 2021-08-24 DIAGNOSIS — I10 Essential (primary) hypertension: Secondary | ICD-10-CM | POA: Diagnosis not present

## 2021-08-24 DIAGNOSIS — K219 Gastro-esophageal reflux disease without esophagitis: Secondary | ICD-10-CM | POA: Diagnosis not present

## 2021-08-24 DIAGNOSIS — N186 End stage renal disease: Secondary | ICD-10-CM | POA: Diagnosis not present

## 2021-08-24 DIAGNOSIS — D631 Anemia in chronic kidney disease: Secondary | ICD-10-CM | POA: Diagnosis not present

## 2021-08-24 DIAGNOSIS — R5381 Other malaise: Secondary | ICD-10-CM | POA: Diagnosis not present

## 2021-08-24 DIAGNOSIS — D649 Anemia, unspecified: Secondary | ICD-10-CM | POA: Diagnosis not present

## 2021-08-24 DIAGNOSIS — R011 Cardiac murmur, unspecified: Secondary | ICD-10-CM | POA: Diagnosis not present

## 2021-08-24 DIAGNOSIS — L89202 Pressure ulcer of unspecified hip, stage 2: Secondary | ICD-10-CM | POA: Diagnosis not present

## 2021-08-24 DIAGNOSIS — K5900 Constipation, unspecified: Secondary | ICD-10-CM | POA: Diagnosis not present

## 2021-08-25 DIAGNOSIS — E785 Hyperlipidemia, unspecified: Secondary | ICD-10-CM | POA: Diagnosis not present

## 2021-08-25 DIAGNOSIS — N186 End stage renal disease: Secondary | ICD-10-CM | POA: Diagnosis not present

## 2021-08-25 DIAGNOSIS — I120 Hypertensive chronic kidney disease with stage 5 chronic kidney disease or end stage renal disease: Secondary | ICD-10-CM | POA: Diagnosis not present

## 2021-08-25 DIAGNOSIS — R011 Cardiac murmur, unspecified: Secondary | ICD-10-CM | POA: Diagnosis not present

## 2021-08-25 DIAGNOSIS — I2510 Atherosclerotic heart disease of native coronary artery without angina pectoris: Secondary | ICD-10-CM | POA: Diagnosis not present

## 2021-08-25 DIAGNOSIS — D631 Anemia in chronic kidney disease: Secondary | ICD-10-CM | POA: Diagnosis not present

## 2021-08-30 DIAGNOSIS — N186 End stage renal disease: Secondary | ICD-10-CM | POA: Diagnosis not present

## 2021-08-30 DIAGNOSIS — E785 Hyperlipidemia, unspecified: Secondary | ICD-10-CM | POA: Diagnosis not present

## 2021-08-30 DIAGNOSIS — I2510 Atherosclerotic heart disease of native coronary artery without angina pectoris: Secondary | ICD-10-CM | POA: Diagnosis not present

## 2021-08-30 DIAGNOSIS — I120 Hypertensive chronic kidney disease with stage 5 chronic kidney disease or end stage renal disease: Secondary | ICD-10-CM | POA: Diagnosis not present

## 2021-08-30 DIAGNOSIS — R011 Cardiac murmur, unspecified: Secondary | ICD-10-CM | POA: Diagnosis not present

## 2021-08-30 DIAGNOSIS — D631 Anemia in chronic kidney disease: Secondary | ICD-10-CM | POA: Diagnosis not present

## 2021-09-01 DIAGNOSIS — N186 End stage renal disease: Secondary | ICD-10-CM | POA: Diagnosis not present

## 2021-09-01 DIAGNOSIS — I120 Hypertensive chronic kidney disease with stage 5 chronic kidney disease or end stage renal disease: Secondary | ICD-10-CM | POA: Diagnosis not present

## 2021-09-01 DIAGNOSIS — R5381 Other malaise: Secondary | ICD-10-CM | POA: Diagnosis not present

## 2021-09-01 DIAGNOSIS — K219 Gastro-esophageal reflux disease without esophagitis: Secondary | ICD-10-CM | POA: Diagnosis not present

## 2021-09-01 DIAGNOSIS — K5900 Constipation, unspecified: Secondary | ICD-10-CM | POA: Diagnosis not present

## 2021-09-01 DIAGNOSIS — D631 Anemia in chronic kidney disease: Secondary | ICD-10-CM | POA: Diagnosis not present

## 2021-09-01 DIAGNOSIS — I2510 Atherosclerotic heart disease of native coronary artery without angina pectoris: Secondary | ICD-10-CM | POA: Diagnosis not present

## 2021-09-01 DIAGNOSIS — E785 Hyperlipidemia, unspecified: Secondary | ICD-10-CM | POA: Diagnosis not present

## 2021-09-01 DIAGNOSIS — R011 Cardiac murmur, unspecified: Secondary | ICD-10-CM | POA: Diagnosis not present

## 2021-09-01 DIAGNOSIS — L89202 Pressure ulcer of unspecified hip, stage 2: Secondary | ICD-10-CM | POA: Diagnosis not present

## 2021-09-02 DIAGNOSIS — E785 Hyperlipidemia, unspecified: Secondary | ICD-10-CM | POA: Diagnosis not present

## 2021-09-02 DIAGNOSIS — I120 Hypertensive chronic kidney disease with stage 5 chronic kidney disease or end stage renal disease: Secondary | ICD-10-CM | POA: Diagnosis not present

## 2021-09-02 DIAGNOSIS — N186 End stage renal disease: Secondary | ICD-10-CM | POA: Diagnosis not present

## 2021-09-02 DIAGNOSIS — R011 Cardiac murmur, unspecified: Secondary | ICD-10-CM | POA: Diagnosis not present

## 2021-09-02 DIAGNOSIS — D631 Anemia in chronic kidney disease: Secondary | ICD-10-CM | POA: Diagnosis not present

## 2021-09-02 DIAGNOSIS — I2510 Atherosclerotic heart disease of native coronary artery without angina pectoris: Secondary | ICD-10-CM | POA: Diagnosis not present

## 2021-09-06 DIAGNOSIS — E785 Hyperlipidemia, unspecified: Secondary | ICD-10-CM | POA: Diagnosis not present

## 2021-09-06 DIAGNOSIS — I2510 Atherosclerotic heart disease of native coronary artery without angina pectoris: Secondary | ICD-10-CM | POA: Diagnosis not present

## 2021-09-06 DIAGNOSIS — R011 Cardiac murmur, unspecified: Secondary | ICD-10-CM | POA: Diagnosis not present

## 2021-09-06 DIAGNOSIS — N186 End stage renal disease: Secondary | ICD-10-CM | POA: Diagnosis not present

## 2021-09-06 DIAGNOSIS — D631 Anemia in chronic kidney disease: Secondary | ICD-10-CM | POA: Diagnosis not present

## 2021-09-06 DIAGNOSIS — I120 Hypertensive chronic kidney disease with stage 5 chronic kidney disease or end stage renal disease: Secondary | ICD-10-CM | POA: Diagnosis not present

## 2021-09-08 DIAGNOSIS — R011 Cardiac murmur, unspecified: Secondary | ICD-10-CM | POA: Diagnosis not present

## 2021-09-08 DIAGNOSIS — I120 Hypertensive chronic kidney disease with stage 5 chronic kidney disease or end stage renal disease: Secondary | ICD-10-CM | POA: Diagnosis not present

## 2021-09-08 DIAGNOSIS — E785 Hyperlipidemia, unspecified: Secondary | ICD-10-CM | POA: Diagnosis not present

## 2021-09-08 DIAGNOSIS — I2510 Atherosclerotic heart disease of native coronary artery without angina pectoris: Secondary | ICD-10-CM | POA: Diagnosis not present

## 2021-09-08 DIAGNOSIS — D631 Anemia in chronic kidney disease: Secondary | ICD-10-CM | POA: Diagnosis not present

## 2021-09-08 DIAGNOSIS — N186 End stage renal disease: Secondary | ICD-10-CM | POA: Diagnosis not present

## 2021-09-13 DIAGNOSIS — E785 Hyperlipidemia, unspecified: Secondary | ICD-10-CM | POA: Diagnosis not present

## 2021-09-13 DIAGNOSIS — D631 Anemia in chronic kidney disease: Secondary | ICD-10-CM | POA: Diagnosis not present

## 2021-09-13 DIAGNOSIS — I120 Hypertensive chronic kidney disease with stage 5 chronic kidney disease or end stage renal disease: Secondary | ICD-10-CM | POA: Diagnosis not present

## 2021-09-13 DIAGNOSIS — N186 End stage renal disease: Secondary | ICD-10-CM | POA: Diagnosis not present

## 2021-09-13 DIAGNOSIS — I2510 Atherosclerotic heart disease of native coronary artery without angina pectoris: Secondary | ICD-10-CM | POA: Diagnosis not present

## 2021-09-13 DIAGNOSIS — R011 Cardiac murmur, unspecified: Secondary | ICD-10-CM | POA: Diagnosis not present

## 2021-09-14 DIAGNOSIS — D631 Anemia in chronic kidney disease: Secondary | ICD-10-CM | POA: Diagnosis not present

## 2021-09-14 DIAGNOSIS — I120 Hypertensive chronic kidney disease with stage 5 chronic kidney disease or end stage renal disease: Secondary | ICD-10-CM | POA: Diagnosis not present

## 2021-09-14 DIAGNOSIS — N186 End stage renal disease: Secondary | ICD-10-CM | POA: Diagnosis not present

## 2021-09-14 DIAGNOSIS — R011 Cardiac murmur, unspecified: Secondary | ICD-10-CM | POA: Diagnosis not present

## 2021-09-14 DIAGNOSIS — E785 Hyperlipidemia, unspecified: Secondary | ICD-10-CM | POA: Diagnosis not present

## 2021-09-14 DIAGNOSIS — I2510 Atherosclerotic heart disease of native coronary artery without angina pectoris: Secondary | ICD-10-CM | POA: Diagnosis not present

## 2021-09-15 DIAGNOSIS — E785 Hyperlipidemia, unspecified: Secondary | ICD-10-CM | POA: Diagnosis not present

## 2021-09-15 DIAGNOSIS — I2510 Atherosclerotic heart disease of native coronary artery without angina pectoris: Secondary | ICD-10-CM | POA: Diagnosis not present

## 2021-09-15 DIAGNOSIS — R011 Cardiac murmur, unspecified: Secondary | ICD-10-CM | POA: Diagnosis not present

## 2021-09-15 DIAGNOSIS — I120 Hypertensive chronic kidney disease with stage 5 chronic kidney disease or end stage renal disease: Secondary | ICD-10-CM | POA: Diagnosis not present

## 2021-09-15 DIAGNOSIS — D631 Anemia in chronic kidney disease: Secondary | ICD-10-CM | POA: Diagnosis not present

## 2021-09-15 DIAGNOSIS — N186 End stage renal disease: Secondary | ICD-10-CM | POA: Diagnosis not present

## 2021-09-17 DIAGNOSIS — D631 Anemia in chronic kidney disease: Secondary | ICD-10-CM | POA: Diagnosis not present

## 2021-09-17 DIAGNOSIS — K219 Gastro-esophageal reflux disease without esophagitis: Secondary | ICD-10-CM | POA: Diagnosis not present

## 2021-09-17 DIAGNOSIS — I120 Hypertensive chronic kidney disease with stage 5 chronic kidney disease or end stage renal disease: Secondary | ICD-10-CM | POA: Diagnosis not present

## 2021-09-17 DIAGNOSIS — E785 Hyperlipidemia, unspecified: Secondary | ICD-10-CM | POA: Diagnosis not present

## 2021-09-17 DIAGNOSIS — I2510 Atherosclerotic heart disease of native coronary artery without angina pectoris: Secondary | ICD-10-CM | POA: Diagnosis not present

## 2021-09-17 DIAGNOSIS — L309 Dermatitis, unspecified: Secondary | ICD-10-CM | POA: Diagnosis not present

## 2021-09-17 DIAGNOSIS — Z681 Body mass index (BMI) 19 or less, adult: Secondary | ICD-10-CM | POA: Diagnosis not present

## 2021-09-17 DIAGNOSIS — Z8042 Family history of malignant neoplasm of prostate: Secondary | ICD-10-CM | POA: Diagnosis not present

## 2021-09-17 DIAGNOSIS — K5900 Constipation, unspecified: Secondary | ICD-10-CM | POA: Diagnosis not present

## 2021-09-17 DIAGNOSIS — S42301S Unspecified fracture of shaft of humerus, right arm, sequela: Secondary | ICD-10-CM | POA: Diagnosis not present

## 2021-09-17 DIAGNOSIS — R011 Cardiac murmur, unspecified: Secondary | ICD-10-CM | POA: Diagnosis not present

## 2021-09-17 DIAGNOSIS — Z9181 History of falling: Secondary | ICD-10-CM | POA: Diagnosis not present

## 2021-09-17 DIAGNOSIS — R634 Abnormal weight loss: Secondary | ICD-10-CM | POA: Diagnosis not present

## 2021-09-17 DIAGNOSIS — N186 End stage renal disease: Secondary | ICD-10-CM | POA: Diagnosis not present

## 2021-09-17 DIAGNOSIS — I252 Old myocardial infarction: Secondary | ICD-10-CM | POA: Diagnosis not present

## 2021-09-19 DIAGNOSIS — D631 Anemia in chronic kidney disease: Secondary | ICD-10-CM | POA: Diagnosis not present

## 2021-09-19 DIAGNOSIS — I2510 Atherosclerotic heart disease of native coronary artery without angina pectoris: Secondary | ICD-10-CM | POA: Diagnosis not present

## 2021-09-19 DIAGNOSIS — R011 Cardiac murmur, unspecified: Secondary | ICD-10-CM | POA: Diagnosis not present

## 2021-09-19 DIAGNOSIS — I120 Hypertensive chronic kidney disease with stage 5 chronic kidney disease or end stage renal disease: Secondary | ICD-10-CM | POA: Diagnosis not present

## 2021-09-19 DIAGNOSIS — E785 Hyperlipidemia, unspecified: Secondary | ICD-10-CM | POA: Diagnosis not present

## 2021-09-19 DIAGNOSIS — N186 End stage renal disease: Secondary | ICD-10-CM | POA: Diagnosis not present

## 2021-09-20 DIAGNOSIS — E785 Hyperlipidemia, unspecified: Secondary | ICD-10-CM | POA: Diagnosis not present

## 2021-09-20 DIAGNOSIS — D631 Anemia in chronic kidney disease: Secondary | ICD-10-CM | POA: Diagnosis not present

## 2021-09-20 DIAGNOSIS — I2510 Atherosclerotic heart disease of native coronary artery without angina pectoris: Secondary | ICD-10-CM | POA: Diagnosis not present

## 2021-09-20 DIAGNOSIS — R011 Cardiac murmur, unspecified: Secondary | ICD-10-CM | POA: Diagnosis not present

## 2021-09-20 DIAGNOSIS — N186 End stage renal disease: Secondary | ICD-10-CM | POA: Diagnosis not present

## 2021-09-20 DIAGNOSIS — I120 Hypertensive chronic kidney disease with stage 5 chronic kidney disease or end stage renal disease: Secondary | ICD-10-CM | POA: Diagnosis not present

## 2021-09-21 DIAGNOSIS — R5381 Other malaise: Secondary | ICD-10-CM | POA: Diagnosis not present

## 2021-09-21 DIAGNOSIS — K5900 Constipation, unspecified: Secondary | ICD-10-CM | POA: Diagnosis not present

## 2021-09-21 DIAGNOSIS — D631 Anemia in chronic kidney disease: Secondary | ICD-10-CM | POA: Diagnosis not present

## 2021-09-21 DIAGNOSIS — N186 End stage renal disease: Secondary | ICD-10-CM | POA: Diagnosis not present

## 2021-09-21 DIAGNOSIS — I2510 Atherosclerotic heart disease of native coronary artery without angina pectoris: Secondary | ICD-10-CM | POA: Diagnosis not present

## 2021-09-21 DIAGNOSIS — E785 Hyperlipidemia, unspecified: Secondary | ICD-10-CM | POA: Diagnosis not present

## 2021-09-21 DIAGNOSIS — D649 Anemia, unspecified: Secondary | ICD-10-CM | POA: Diagnosis not present

## 2021-09-21 DIAGNOSIS — E46 Unspecified protein-calorie malnutrition: Secondary | ICD-10-CM | POA: Diagnosis not present

## 2021-09-21 DIAGNOSIS — K219 Gastro-esophageal reflux disease without esophagitis: Secondary | ICD-10-CM | POA: Diagnosis not present

## 2021-09-21 DIAGNOSIS — I10 Essential (primary) hypertension: Secondary | ICD-10-CM | POA: Diagnosis not present

## 2021-09-21 DIAGNOSIS — I120 Hypertensive chronic kidney disease with stage 5 chronic kidney disease or end stage renal disease: Secondary | ICD-10-CM | POA: Diagnosis not present

## 2021-09-21 DIAGNOSIS — B370 Candidal stomatitis: Secondary | ICD-10-CM | POA: Diagnosis not present

## 2021-09-21 DIAGNOSIS — R638 Other symptoms and signs concerning food and fluid intake: Secondary | ICD-10-CM | POA: Diagnosis not present

## 2021-09-21 DIAGNOSIS — R011 Cardiac murmur, unspecified: Secondary | ICD-10-CM | POA: Diagnosis not present

## 2021-09-22 DIAGNOSIS — N186 End stage renal disease: Secondary | ICD-10-CM | POA: Diagnosis not present

## 2021-09-22 DIAGNOSIS — I2510 Atherosclerotic heart disease of native coronary artery without angina pectoris: Secondary | ICD-10-CM | POA: Diagnosis not present

## 2021-09-22 DIAGNOSIS — E785 Hyperlipidemia, unspecified: Secondary | ICD-10-CM | POA: Diagnosis not present

## 2021-09-22 DIAGNOSIS — D631 Anemia in chronic kidney disease: Secondary | ICD-10-CM | POA: Diagnosis not present

## 2021-09-22 DIAGNOSIS — I120 Hypertensive chronic kidney disease with stage 5 chronic kidney disease or end stage renal disease: Secondary | ICD-10-CM | POA: Diagnosis not present

## 2021-09-22 DIAGNOSIS — R011 Cardiac murmur, unspecified: Secondary | ICD-10-CM | POA: Diagnosis not present

## 2021-09-23 DIAGNOSIS — D631 Anemia in chronic kidney disease: Secondary | ICD-10-CM | POA: Diagnosis not present

## 2021-09-23 DIAGNOSIS — I120 Hypertensive chronic kidney disease with stage 5 chronic kidney disease or end stage renal disease: Secondary | ICD-10-CM | POA: Diagnosis not present

## 2021-09-23 DIAGNOSIS — R011 Cardiac murmur, unspecified: Secondary | ICD-10-CM | POA: Diagnosis not present

## 2021-09-23 DIAGNOSIS — N186 End stage renal disease: Secondary | ICD-10-CM | POA: Diagnosis not present

## 2021-09-23 DIAGNOSIS — E785 Hyperlipidemia, unspecified: Secondary | ICD-10-CM | POA: Diagnosis not present

## 2021-09-23 DIAGNOSIS — I2510 Atherosclerotic heart disease of native coronary artery without angina pectoris: Secondary | ICD-10-CM | POA: Diagnosis not present

## 2021-09-24 DIAGNOSIS — I120 Hypertensive chronic kidney disease with stage 5 chronic kidney disease or end stage renal disease: Secondary | ICD-10-CM | POA: Diagnosis not present

## 2021-09-24 DIAGNOSIS — D631 Anemia in chronic kidney disease: Secondary | ICD-10-CM | POA: Diagnosis not present

## 2021-09-24 DIAGNOSIS — N186 End stage renal disease: Secondary | ICD-10-CM | POA: Diagnosis not present

## 2021-09-24 DIAGNOSIS — I2510 Atherosclerotic heart disease of native coronary artery without angina pectoris: Secondary | ICD-10-CM | POA: Diagnosis not present

## 2021-09-24 DIAGNOSIS — R011 Cardiac murmur, unspecified: Secondary | ICD-10-CM | POA: Diagnosis not present

## 2021-09-24 DIAGNOSIS — E785 Hyperlipidemia, unspecified: Secondary | ICD-10-CM | POA: Diagnosis not present

## 2021-09-25 DIAGNOSIS — I120 Hypertensive chronic kidney disease with stage 5 chronic kidney disease or end stage renal disease: Secondary | ICD-10-CM | POA: Diagnosis not present

## 2021-09-25 DIAGNOSIS — D631 Anemia in chronic kidney disease: Secondary | ICD-10-CM | POA: Diagnosis not present

## 2021-09-25 DIAGNOSIS — R011 Cardiac murmur, unspecified: Secondary | ICD-10-CM | POA: Diagnosis not present

## 2021-09-25 DIAGNOSIS — N186 End stage renal disease: Secondary | ICD-10-CM | POA: Diagnosis not present

## 2021-09-25 DIAGNOSIS — I2510 Atherosclerotic heart disease of native coronary artery without angina pectoris: Secondary | ICD-10-CM | POA: Diagnosis not present

## 2021-09-25 DIAGNOSIS — E785 Hyperlipidemia, unspecified: Secondary | ICD-10-CM | POA: Diagnosis not present

## 2021-09-26 DIAGNOSIS — I120 Hypertensive chronic kidney disease with stage 5 chronic kidney disease or end stage renal disease: Secondary | ICD-10-CM | POA: Diagnosis not present

## 2021-09-26 DIAGNOSIS — N186 End stage renal disease: Secondary | ICD-10-CM | POA: Diagnosis not present

## 2021-09-26 DIAGNOSIS — I2510 Atherosclerotic heart disease of native coronary artery without angina pectoris: Secondary | ICD-10-CM | POA: Diagnosis not present

## 2021-09-26 DIAGNOSIS — R011 Cardiac murmur, unspecified: Secondary | ICD-10-CM | POA: Diagnosis not present

## 2021-09-26 DIAGNOSIS — D631 Anemia in chronic kidney disease: Secondary | ICD-10-CM | POA: Diagnosis not present

## 2021-09-26 DIAGNOSIS — E785 Hyperlipidemia, unspecified: Secondary | ICD-10-CM | POA: Diagnosis not present

## 2021-09-27 DIAGNOSIS — N186 End stage renal disease: Secondary | ICD-10-CM | POA: Diagnosis not present

## 2021-09-27 DIAGNOSIS — D631 Anemia in chronic kidney disease: Secondary | ICD-10-CM | POA: Diagnosis not present

## 2021-09-27 DIAGNOSIS — E785 Hyperlipidemia, unspecified: Secondary | ICD-10-CM | POA: Diagnosis not present

## 2021-09-27 DIAGNOSIS — I2510 Atherosclerotic heart disease of native coronary artery without angina pectoris: Secondary | ICD-10-CM | POA: Diagnosis not present

## 2021-09-27 DIAGNOSIS — I120 Hypertensive chronic kidney disease with stage 5 chronic kidney disease or end stage renal disease: Secondary | ICD-10-CM | POA: Diagnosis not present

## 2021-09-27 DIAGNOSIS — R011 Cardiac murmur, unspecified: Secondary | ICD-10-CM | POA: Diagnosis not present

## 2021-10-18 DIAGNOSIS — 419620001 Death: Secondary | SNOMED CT | POA: Diagnosis not present

## 2021-10-18 DEATH — deceased

## 2022-07-25 IMAGING — DX DG SHOULDER 2+V*R*
3 series · 3 of 3 positions shown · non-contrast
Comparison: None.

CLINICAL DATA: Right shoulder pain after a fall.

EXAM:
RIGHT SHOULDER - 2+ VIEW

[shoulder ap]
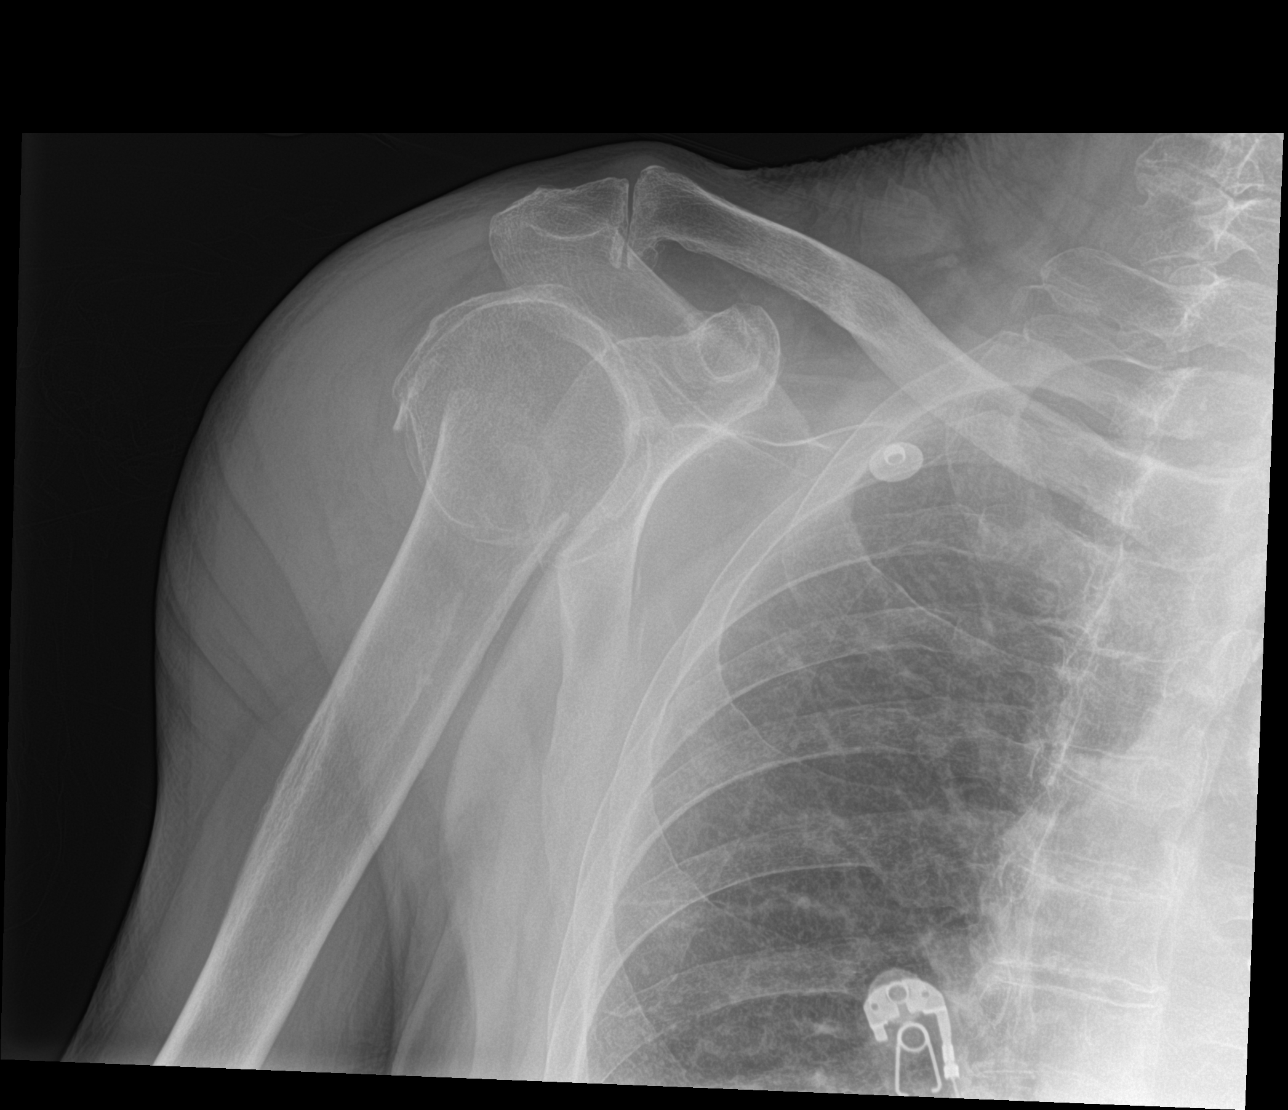

[shoulder obl (1 of 2)]
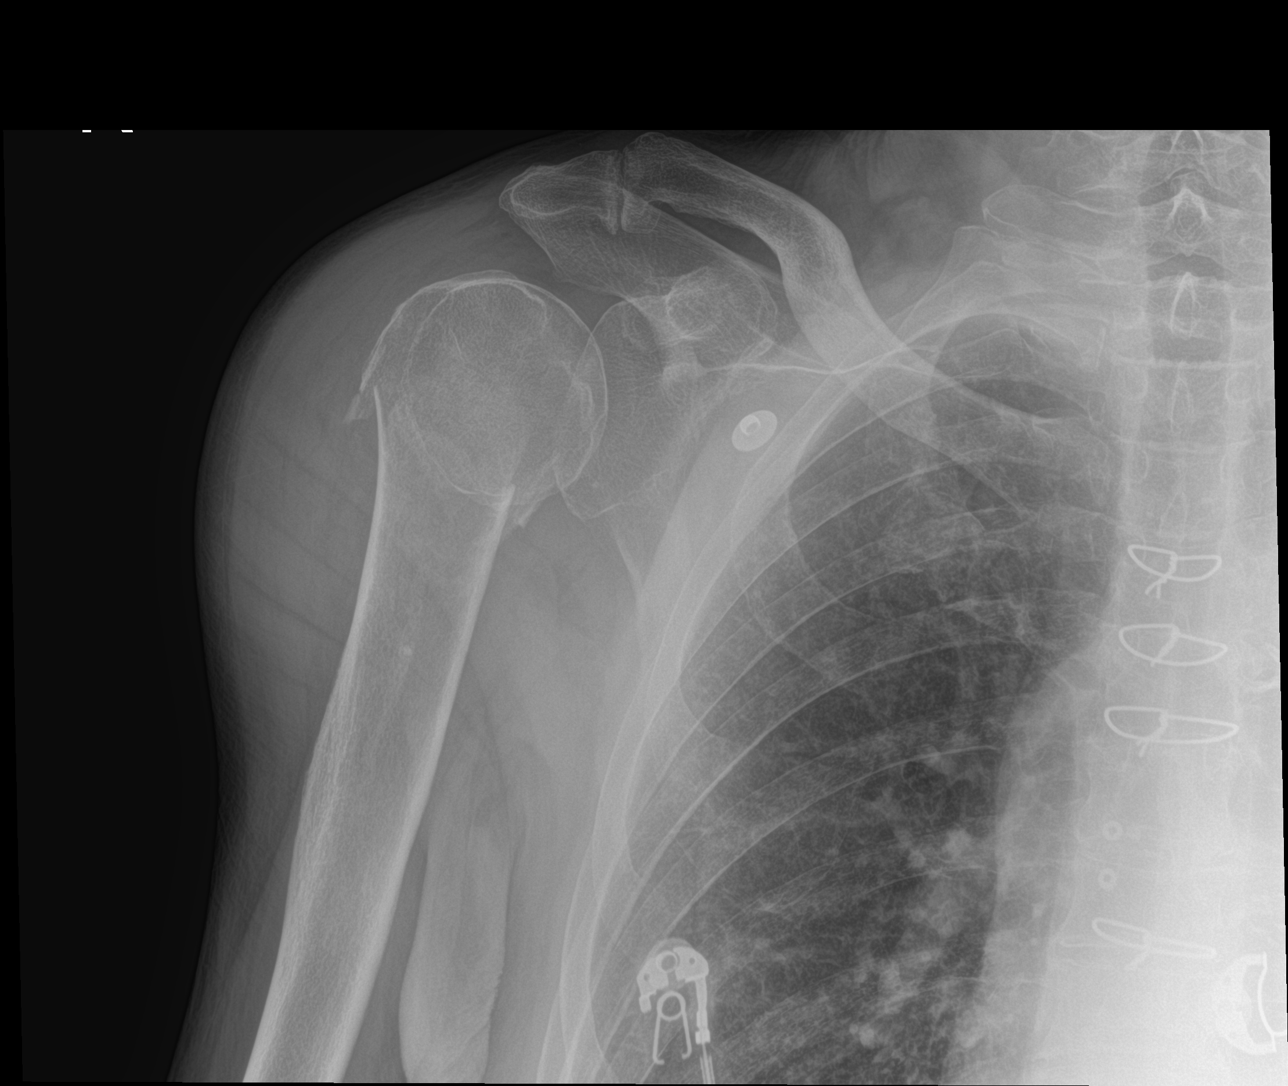

[shoulder obl (2 of 2)]
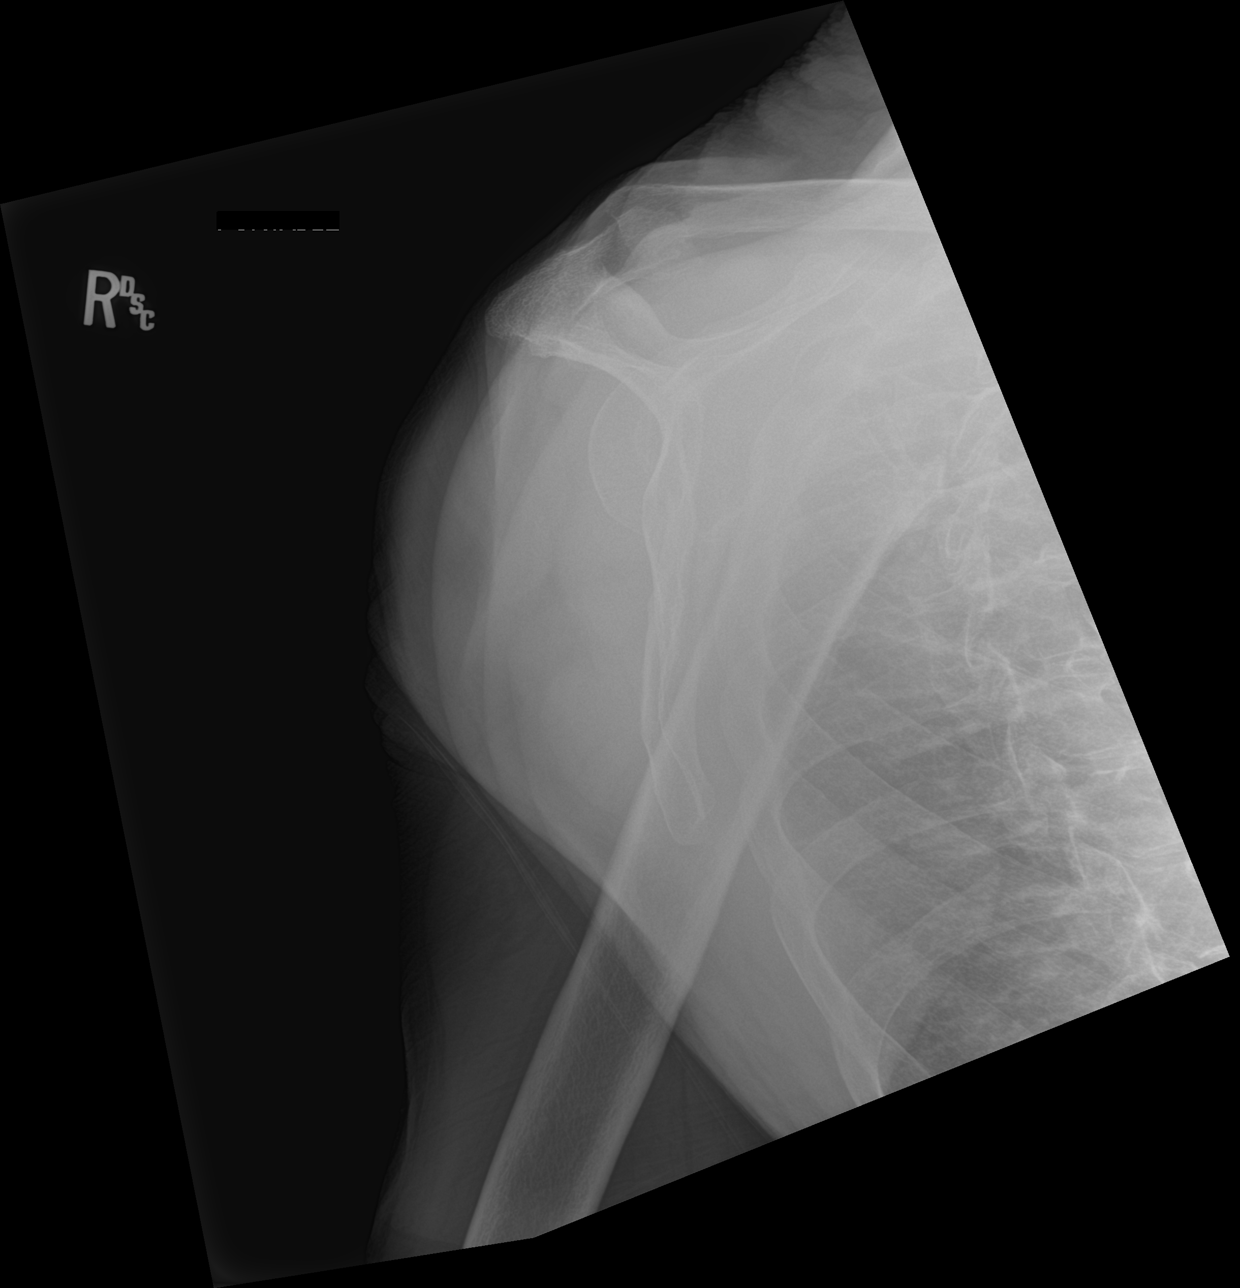

[3 of 3 positions shown; findings below may reference images not displayed]

FINDINGS: There is a mildly comminuted fracture of the humeral neck with
anterior displacement of the humeral shaft relative to the head
fragment. Moderate acromioclavicular osteoarthrosis is noted. There
is been prior CABG.
IMPRESSION: Right humeral neck fracture.
